# Patient Record
Sex: Female | Born: 1985 | Race: White | Hispanic: Yes | Marital: Single | Smoking: Never smoker
Health system: Southern US, Community
[De-identification: ages and names within clinical notes are randomized; demographics above are authoritative.]

## PROBLEM LIST (undated history)

## (undated) ENCOUNTER — Inpatient Hospital Stay (HOSPITAL_COMMUNITY): Payer: Self-pay

## (undated) DIAGNOSIS — J45909 Unspecified asthma, uncomplicated: Secondary | ICD-10-CM

## (undated) DIAGNOSIS — O139 Gestational [pregnancy-induced] hypertension without significant proteinuria, unspecified trimester: Secondary | ICD-10-CM

## (undated) HISTORY — PX: BREAST LUMPECTOMY: SHX2

## (undated) HISTORY — DX: Unspecified asthma, uncomplicated: J45.909

---

## 2012-06-11 ENCOUNTER — Emergency Department (HOSPITAL_COMMUNITY)
Admission: EM | Admit: 2012-06-11 | Discharge: 2012-06-11 | Disposition: A | Discharge status: Home / Self Care | Payer: Self-pay | Attending: Emergency Medicine | Admitting: Emergency Medicine

## 2012-06-11 ENCOUNTER — Encounter (HOSPITAL_COMMUNITY): Payer: Self-pay | Admitting: *Deleted

## 2012-06-11 ENCOUNTER — Emergency Department (HOSPITAL_COMMUNITY): Payer: Self-pay

## 2012-06-11 DIAGNOSIS — Y998 Other external cause status: Secondary | ICD-10-CM | POA: Insufficient documentation

## 2012-06-11 DIAGNOSIS — IMO0002 Reserved for concepts with insufficient information to code with codable children: Secondary | ICD-10-CM | POA: Insufficient documentation

## 2012-06-11 DIAGNOSIS — Y9301 Activity, walking, marching and hiking: Secondary | ICD-10-CM | POA: Insufficient documentation

## 2012-06-11 DIAGNOSIS — X500XXA Overexertion from strenuous movement or load, initial encounter: Secondary | ICD-10-CM | POA: Insufficient documentation

## 2012-06-11 MED ORDER — HYDROCODONE-ACETAMINOPHEN 5-325 MG PO TABS: 1.0000 | ORAL_TABLET | ORAL | Status: AC | PRN

## 2012-06-11 MED ORDER — HYDROCODONE-ACETAMINOPHEN 5-325 MG PO TABS
1.0000 | ORAL_TABLET | Freq: Once | ORAL | Status: AC
  Administered 2012-06-11: 1 via ORAL
  Filled 2012-06-11: qty 1

## 2012-06-11 NOTE — ED Provider Notes (Signed)
History     CSN: [id number]  Arrival date & time 06/11/12  1830   First MD Initiated Contact with Patient 06/11/12 2130      Chief Complaint  Patient presents with  . Hip Pain   HPI  History provided by the patient. Patient is a 26 year old female with no significant PMH who presents with complaints of right hip pain. Patient states that she was walking 3 days ago when she felt a pop and twisted her hip area. Since that time she has had some intermittent pains are worse with walking and movement. Pains have been more persistent all day today. Patient did try using some Aleve without any significant improvement. Pain does not radiate. Pain is a sharp throbbing pain. She denies any numbness weakness in foot. She denies any back pain. She denies any urinary complaints, abdominal pain or menstrual changes.    History reviewed. No pertinent past medical history.  History reviewed. No pertinent past surgical history.  History reviewed. No pertinent family history.  History  Substance Use Topics  . Smoking status: Never Smoker   . Smokeless tobacco: Not on file  . Alcohol Use: No    OB History    Grav Para Term Preterm Abortions TAB SAB Ect Mult Living                  Review of Systems  Constitutional: Negative for fever and chills.  Gastrointestinal: Negative for abdominal pain.  Genitourinary: Negative for dysuria, frequency, flank pain, vaginal bleeding and vaginal discharge.  Musculoskeletal: Negative for back pain.       Right hip pain  Neurological: Negative for weakness and numbness.    Allergies  Review of patient's allergies indicates no known allergies.  Home Medications   Current Outpatient Rx  Name Route Sig Dispense Refill  . ADULT MULTIVITAMIN W/MINERALS CH Oral Take 1 tablet by mouth daily.    Marland Kitchen NAPROXEN SODIUM 220 MG PO TABS Oral Take 660 mg by mouth 2 (two) times daily as needed. For pain      BP 121/78  Pulse 89  Temp 98 F (36.7 C) (Oral)   Resp 17  SpO2 100%  LMP 06/07/2012  Physical Exam  Nursing note and vitals reviewed. Constitutional: She is oriented to person, place, and time. She appears well-developed and well-nourished. No distress.  HENT:  Head: Normocephalic.  Cardiovascular: Normal rate and regular rhythm.   Pulmonary/Chest: Effort normal and breath sounds normal. No respiratory distress. She has no wheezes. She has no rales.  Abdominal: Soft. There is no tenderness.       No CVA tenderness  Musculoskeletal: She exhibits no edema.       Right hip: She exhibits tenderness. She exhibits no bony tenderness.       Legs:      Slightly reduced range of motion of right hip with pain. There is tenderness to palpation over the anterior portion of the proximal quadriceps. Patient has normal distal pulses and sensations in foot. Normal strength in the foot and lower leg. No gross deformities.  Neurological: She is alert and oriented to person, place, and time.  Skin: Skin is warm and dry.  Psychiatric: She has a normal mood and affect. Her behavior is normal.    ED Course  Procedures   Results for orders placed during the hospital encounter of 06/11/12  POCT PREGNANCY, URINE      Component Value Range   Preg Test, Ur NEGATIVE  NEGATIVE  Dg Hip Complete Right  06/11/2012  *RADIOLOGY REPORT*  Clinical Data: Right hip pain for 3 days.  No known injury.  RIGHT HIP - COMPLETE 2+ VIEW  Comparison: None.  Findings: Imaged bones, joints and soft tissues appear normal.  IMPRESSION: Negative exam.   Original Report Authenticated By: Bernadene Bell. D'ALESSIO, M.D.      1. Sprain of hip or thigh, right       MDM  9:50 PM patient seen and evaluated. [name] with no significant PMH. Patient with very minor trauma to cause symptoms. No fall or high impact. Patient is very concerned and felt she may need x-rays. There is very low clinical suspicion for fractures or dislocation. Patient is very tender over the hip.  X-rays were performed.  X-rays unremarkable. At this time suspect sprain or muscle strain. Will provided crutches to help with rest. Patient provided prescription for Norco 5 mg x10. Also instructed on rice and ibuprofen        Angus Seller, [place] 06/11/12 2234

## 2012-06-11 NOTE — ED Notes (Signed)
Pt states that she was walking and stepped wrong causing right hip pain. Pt reports continue pain, increases with movement

## 2012-06-11 NOTE — Progress Notes (Signed)
Orthopedic Tech Progress Note Patient Details:  Robin Price [birth year] [id number]  Ortho Devices Type of Ortho Device: Crutches Ortho Device/Splint Interventions: Casandra Doffing 06/11/2012, 10:38 PM

## 2012-06-15 NOTE — ED Provider Notes (Signed)
Medical screening examination/treatment/procedure(s) were performed by non-physician practitioner and as supervising physician I was immediately available for consultation/collaboration.  John-Adam Adryana Mogensen, M.D.     John-Adam Kayal Mula, MD 06/15/12 1205 

## 2015-05-01 LAB — OB RESULTS CONSOLE ABO/RH: RH TYPE: POSITIVE

## 2015-05-01 LAB — OB RESULTS CONSOLE RPR: RPR: NONREACTIVE

## 2015-05-01 LAB — OB RESULTS CONSOLE GC/CHLAMYDIA
Chlamydia: NEGATIVE
GC PROBE AMP, GENITAL: NEGATIVE

## 2015-05-01 LAB — OB RESULTS CONSOLE ANTIBODY SCREEN: Antibody Screen: NEGATIVE

## 2015-05-01 LAB — OB RESULTS CONSOLE HEPATITIS B SURFACE ANTIGEN: HEP B S AG: NEGATIVE

## 2015-05-01 LAB — OB RESULTS CONSOLE HIV ANTIBODY (ROUTINE TESTING): HIV: NONREACTIVE

## 2015-05-01 LAB — OB RESULTS CONSOLE RUBELLA ANTIBODY, IGM: RUBELLA: IMMUNE

## 2015-06-28 ENCOUNTER — Other Ambulatory Visit (HOSPITAL_COMMUNITY): Payer: Self-pay | Admitting: Nurse Practitioner

## 2015-06-28 DIAGNOSIS — Z3A19 19 weeks gestation of pregnancy: Secondary | ICD-10-CM

## 2015-06-28 DIAGNOSIS — Z3689 Encounter for other specified antenatal screening: Secondary | ICD-10-CM

## 2015-07-03 ENCOUNTER — Ambulatory Visit (HOSPITAL_COMMUNITY)
Admission: RE | Admit: 2015-07-03 | Discharge: 2015-07-03 | Disposition: A | Discharge status: Home / Self Care | Payer: Self-pay | Source: Ambulatory Visit | Attending: Nurse Practitioner | Admitting: Nurse Practitioner

## 2015-07-03 DIAGNOSIS — Z36 Encounter for antenatal screening of mother: Secondary | ICD-10-CM | POA: Insufficient documentation

## 2015-07-03 DIAGNOSIS — Z3A19 19 weeks gestation of pregnancy: Secondary | ICD-10-CM | POA: Insufficient documentation

## 2015-07-03 DIAGNOSIS — Z3689 Encounter for other specified antenatal screening: Secondary | ICD-10-CM

## 2015-10-13 ENCOUNTER — Encounter (HOSPITAL_COMMUNITY): Payer: Self-pay | Admitting: *Deleted

## 2015-10-13 ENCOUNTER — Inpatient Hospital Stay (HOSPITAL_COMMUNITY)
Admission: AD | Admit: 2015-10-13 | Discharge: 2015-10-14 | Disposition: A | Discharge status: Home / Self Care | Payer: Self-pay | Source: Ambulatory Visit | Attending: Obstetrics and Gynecology | Admitting: Obstetrics and Gynecology

## 2015-10-13 DIAGNOSIS — O4700 False labor before 37 completed weeks of gestation, unspecified trimester: Secondary | ICD-10-CM

## 2015-10-13 DIAGNOSIS — Z3A33 33 weeks gestation of pregnancy: Secondary | ICD-10-CM | POA: Insufficient documentation

## 2015-10-13 DIAGNOSIS — O479 False labor, unspecified: Secondary | ICD-10-CM

## 2015-10-13 LAB — URINE MICROSCOPIC-ADD ON

## 2015-10-13 LAB — URINALYSIS, ROUTINE W REFLEX MICROSCOPIC
Bilirubin Urine: NEGATIVE
GLUCOSE, UA: NEGATIVE mg/dL
Hgb urine dipstick: NEGATIVE
Ketones, ur: NEGATIVE mg/dL
Nitrite: NEGATIVE
PROTEIN: NEGATIVE mg/dL
Specific Gravity, Urine: 1.01 (ref 1.005–1.030)
pH: 7 (ref 5.0–8.0)

## 2015-10-13 MED ORDER — LACTATED RINGERS IV BOLUS (SEPSIS)
1000.0000 mL | Freq: Once | INTRAVENOUS | Status: AC
  Administered 2015-10-13: 1000 mL via INTRAVENOUS

## 2015-10-13 MED ORDER — NIFEDIPINE 10 MG PO CAPS
20.0000 mg | ORAL_CAPSULE | Freq: Three times a day (TID) | ORAL | Status: DC
Start: 2015-10-13 — End: 2015-10-14
  Administered 2015-10-13 (×2): 20 mg via ORAL
  Filled 2015-10-13 (×2): qty 2

## 2015-10-13 NOTE — MAU Provider Note (Signed)
  History     CSN: [id number]  Arrival date and time: 10/13/15 1906   None     Chief Complaint  Patient presents with  . Abdominal Pain  . Back Pain  . Vaginal Bleeding   HPI  Robin Price 29 y.o. G1P0 @ 344w0d presents toMAU stating that she has had contractions on and off for 2 days. She reported a small amount of bleeding at 530pm and none since.She denies LOF. Reports positive fetal movement.   History reviewed. No pertinent past medical history.  History reviewed. No pertinent past surgical history.  History reviewed. No pertinent family history.  Social History  Substance Use Topics  . Smoking status: Never Smoker   . Smokeless tobacco: None  . Alcohol Use: No    Allergies: No Known Allergies  Prescriptions prior to admission  Medication Sig Dispense Refill Last Dose  . Prenatal Vit-Fe Fumarate-FA (PRENATAL MULTIVITAMIN) TABS tablet Take 1 tablet by mouth daily at 12 noon.   10/12/2015 at Unknown time    Review of Systems  Constitutional: Negative for fever.  Gastrointestinal: Positive for abdominal pain.  Genitourinary:       Sm vag bleeding   All other systems reviewed and are negative.  Physical Exam   Blood pressure 111/77, pulse 100, temperature 98.3 F (36.8 C), resp. rate 18, height 5\' 1"  (1.549 m), weight 72.031 kg (158 lb 12.8 oz).  Physical Exam  Nursing note and vitals reviewed. Constitutional: She is oriented to person, place, and time. She appears well-developed and well-nourished. No distress.  HENT:  Head: Normocephalic and atraumatic.  Neck: Normal range of motion.  Cardiovascular: Normal rate and regular rhythm.   Respiratory: Effort normal and breath sounds normal. No respiratory distress.  GI: Soft. There is no tenderness.  Genitourinary: Vagina normal. No vaginal discharge found.  No vaginal bleeding on exam  Musculoskeletal: Normal range of motion.  Neurological: She is alert and oriented to person, place, and time.   Skin: Skin is warm and dry.  Psychiatric: She has a normal mood and affect. Her behavior is normal. Judgment and thought content normal.    MAU Course  Procedures  MDM  Lactated ringers 1000cc bolus and Procardia. Contractions resolved. FHT's Cat 1  Assessment and Plan  Preterm contractions  Discharge Follow up at regular scheduled appt.   Clemmons,Lori Grissett 10/13/2015, 10:05 PM

## 2015-10-13 NOTE — MAU Note (Signed)
Having back and abd pain for two days. Like period cramps. About 1730 saw dark red on panties. Has not seen anymore since. Peeing a lot and back hurts more in to mid back

## 2015-10-14 NOTE — Discharge Instructions (Signed)
Back Pain in Pregnancy °Back pain during pregnancy is common. It happens in about half of all pregnancies. It is important for you and your baby that you remain active during your pregnancy. If you feel that back pain is not allowing you to remain active or sleep well, it is time to see your caregiver. Back pain may be caused by several factors related to changes during your pregnancy. Fortunately, unless you had trouble with your back before your pregnancy, the pain is likely to get better after you deliver. °Low back pain usually occurs between the fifth and seventh months of pregnancy. It can, however, happen in the first couple months. Factors that increase the risk of back problems include:  °· Previous back problems. °· Injury to your back. °· Having twins or multiple births. °· A chronic cough. °· Stress. °· Job-related repetitive motions. °· Muscle or spinal disease in the back. °· Family history of back problems, ruptured (herniated) discs, or osteoporosis. °· Depression, anxiety, and panic attacks. °CAUSES  °· When you are pregnant, your body produces a hormone called relaxin. This hormone makes the ligaments connecting the low back and pubic bones more flexible. This flexibility allows the baby to be delivered more easily. When your ligaments are loose, your muscles need to work harder to support your back. Soreness in your back can come from tired muscles. Soreness can also come from back tissues that are irritated since they are receiving less support. °· As the baby grows, it puts pressure on the nerves and blood vessels in your pelvis. This can cause back pain. °· As the baby grows and gets heavier during pregnancy, the uterus pushes the stomach muscles forward and changes your center of gravity. This makes your back muscles work harder to maintain good posture. °SYMPTOMS  °Lumbar pain during pregnancy °Lumbar pain during pregnancy usually occurs at or above the waist in the center of the back. There  may be pain and numbness that radiates into your leg or foot. This is similar to low back pain experienced by non-pregnant women. It usually increases with sitting for long periods of time, standing, or repetitive lifting. Tenderness may also be present in the muscles along your upper back. °Posterior pelvic pain during pregnancy °Pain in the back of the pelvis is more common than lumbar pain in pregnancy. It is a deep pain felt in your side at the waistline, or across the tailbone (sacrum), or in both places. You may have pain on one or both sides. This pain can also go into the buttocks and backs of the upper thighs. Pubic and groin pain may also be present. The pain does not quickly resolve with rest, and morning stiffness may also be present. °Pelvic pain during pregnancy can be brought on by most activities. A high level of fitness before and during pregnancy may or may not prevent this problem. Labor pain is usually 1 to 2 minutes apart, lasts for about 1 minute, and involves a bearing down feeling or pressure in your pelvis. However, if you are at term with the pregnancy, constant low back pain can be the beginning of early labor, and you should be aware of this. °DIAGNOSIS  °X-rays of the back should not be done during the first 12 to 14 weeks of the pregnancy and only when absolutely necessary during the rest of the pregnancy. MRIs do not give off radiation and are safe during pregnancy. MRIs also should only be done when absolutely necessary. °HOME CARE INSTRUCTIONS °· Exercise   as directed by your caregiver. Exercise is the most effective way to prevent or manage back pain. If you have a back problem, it is especially important to avoid sports that require sudden body movements. Swimming and walking are great activities. °· Do not stand in one place for long periods of time. °· Do not wear high heels. °· Sit in chairs with good posture. Use a pillow on your lower back if necessary. Make sure your head  rests over your shoulders and is not hanging forward. °· Try sleeping on your side, preferably the left side, with a pillow or two between your legs. If you are sore after a night's rest, your bed may be too soft. Try placing a board between your mattress and box spring. °· Listen to your body when lifting. If you are experiencing pain, ask for help or try bending your knees more so you can use your leg muscles rather than your back muscles. Squat down when picking up something from the floor. Do not bend over. °· Eat a healthy diet. Try to gain weight within your caregiver's recommendations. °· Use heat or cold packs 3 to 4 times a day for 15 minutes to help with the pain. °· Only take over-the-counter or prescription medicines for pain, discomfort, or fever as directed by your caregiver. °Sudden (acute) back pain °· Use bed rest for only the most extreme, acute episodes of back pain. Prolonged bed rest over 48 hours will aggravate your condition. °· Ice is very effective for acute conditions. °¨ Put ice in a plastic bag. °¨ Place a towel between your skin and the bag. °¨ Leave the ice on for 10 to 20 minutes every 2 hours, or as needed. °· Using heat packs for 30 minutes prior to activities is also helpful. °Continued back pain °See your caregiver if you have continued problems. Your caregiver can help or refer you for appropriate physical therapy. With conditioning, most back problems can be avoided. Sometimes, a more serious issue may be the cause of back pain. You should be seen right away if new problems seem to be developing. Your caregiver may recommend: °· A maternity girdle. °· An elastic sling. °· A back brace. °· A massage therapist or acupuncture. °SEEK MEDICAL CARE IF:  °· You are not able to do most of your daily activities, even when taking the pain medicine you were given. °· You need a referral to a physical therapist or chiropractor. °· You want to try acupuncture. °SEEK IMMEDIATE MEDICAL CARE  IF: °· You develop numbness, tingling, weakness, or problems with the use of your arms or legs. °· You develop severe back pain that is no longer relieved with medicines. °· You have a sudden change in bowel or bladder control. °· You have increasing pain in other areas of the body. °· You develop shortness of breath, dizziness, or fainting. °· You develop nausea, vomiting, or sweating. °· You have back pain which is similar to labor pains. °· You have back pain along with your water breaking or vaginal bleeding. °· You have back pain or numbness that travels down your leg. °· Your back pain developed after you fell. °· You develop pain on one side of your back. You may have a kidney stone. °· You see blood in your urine. You may have a bladder infection or kidney stone. °· You have back pain with blisters. You may have shingles. °Back pain is fairly common during pregnancy but should not be accepted as just part of   the process. Back pain should always be treated as soon as possible. This will make your pregnancy as pleasant as possible.   This information is not intended to replace advice given to you by your health care provider. Make sure you discuss any questions you have with your health care provider.   Document Released: 01/08/2006 Document Revised: 12/23/2011 Document Reviewed: 02/19/2011 Elsevier Interactive Patient Education 2016 Else[employer] of the uterus can occur throughout pregnancy. Contractions are not always a sign that you are in labor.  WHAT ARE BRAXTON HICKS CONTRACTIONS?  Contractions that occur before labor are called Braxton Hicks contractions, or false labor. Toward the end of pregnancy (32-34 weeks), these contractions can develop more often and may become more forceful. This is not true labor because these contractions do not result in opening (dilatation) and thinning of the cervix. They are sometimes difficult to tell apart from true  labor because these contractions can be forceful and people have different pain tolerances. You should not feel embarrassed if you go to the hospital with false labor. Sometimes, the only way to tell if you are in true labor is for your health care provider to look for changes in the cervix. If there are no prenatal problems or other health problems associated with the pregnancy, it is completely safe to be sent home with false labor and await the onset of true labor. HOW CAN YOU TELL THE DIFFERENCE BETWEEN TRUE AND FALSE LABOR? False Labor  The contractions of false labor are usually shorter and not as hard as those of true labor.   The contractions are usually irregular.   The contractions are often felt in the front of the lower abdomen and in the groin.   The contractions may go away when you walk around or change positions while lying down.   The contractions get weaker and are shorter lasting as time goes on.   The contractions do not usually become progressively stronger, regular, and closer together as with true labor.  True Labor  Contractions in true labor last 30-70 seconds, become very regular, usually become more intense, and increase in frequency.   The contractions do not go away with walking.   The discomfort is usually felt in the top of the uterus and spreads to the lower abdomen and low back.   True labor can be determined by your health care provider with an exam. This will show that the cervix is dilating and getting thinner.  WHAT TO REMEMBER  Keep up with your usual exercises and follow other instructions given by your health care provider.   Take medicines as directed by your health care provider.   Keep your regular prenatal appointments.   Eat and drink lightly if you think you are going into labor.   If Braxton Hicks contractions are making you uncomfortable:   Change your position from lying down or resting to walking, or from walking to  resting.   Sit and rest in a tub of warm water.   Drink 2-3 glasses of water. Dehydration may cause these contractions.   Do slow and deep breathing several times an hour.  WHEN SHOULD I SEEK IMMEDIATE MEDICAL CARE? Seek immediate medical care if:  Your contractions become stronger, more regular, and closer together.   You have fluid leaking or gushing from your vagina.   You have a fever.   You pass blood-tinged mucus.   You have vaginal bleeding.   You have continuous  abdominal pain.   You have low back pain that you never had before.   You feel your baby's head pushing down and causing pelvic pressure.   Your baby is not moving as much as it used to.    This information is not intended to replace advice given to you by your health care provider. Make sure you discuss any questions you have with your health care provider.   Document Released: 09/30/2005 Document Revised: 10/05/2013 Document Reviewed: 07/12/2013 Elsevier Interactive Patient Education [employer]2016 Else[employer] Inc[employer]

## 2015-10-26 LAB — OB RESULTS CONSOLE GC/CHLAMYDIA
CHLAMYDIA, DNA PROBE: NEGATIVE
Gonorrhea: NEGATIVE

## 2015-10-28 LAB — OB RESULTS CONSOLE GBS: GBS: NEGATIVE

## 2015-11-22 ENCOUNTER — Encounter (HOSPITAL_COMMUNITY): Payer: Self-pay | Admitting: *Deleted

## 2015-11-22 ENCOUNTER — Inpatient Hospital Stay (HOSPITAL_COMMUNITY)
Admission: AD | Admit: 2015-11-22 | Discharge: 2015-11-22 | Disposition: A | Discharge status: Home / Self Care | Payer: Self-pay | Source: Ambulatory Visit | Attending: Family Medicine | Admitting: Family Medicine

## 2015-11-22 DIAGNOSIS — O26893 Other specified pregnancy related conditions, third trimester: Secondary | ICD-10-CM

## 2015-11-22 DIAGNOSIS — N898 Other specified noninflammatory disorders of vagina: Secondary | ICD-10-CM

## 2015-11-22 DIAGNOSIS — Z3A38 38 weeks gestation of pregnancy: Secondary | ICD-10-CM | POA: Insufficient documentation

## 2015-11-22 LAB — POCT FERN TEST: POCT FERN TEST: NEGATIVE

## 2015-11-22 NOTE — MAU Provider Note (Signed)
Ms. Robin Price is a 30 y.o. G1P0 at [redacted]w[redacted]d  who presents to MAU today complaining of LOF and contractions. The patient states that she has noted LOF since Monday morning. She states scant blood tinged discharge noted since then as well. She denies complications with the pregnancy. She reports good fetal movement. She states frequent contractions noted tonight.    BP 124/80 mmHg  Pulse 67 GENERAL: Well-developed, well-nourished female in no acute distress.  HEAD: Normocephalic, atraumatic.  CHEST: Normal effort of breathing, regular heart rate ABDOMEN: Soft, nontender, nondistended.  PELVIC: Normal external female genitalia. Vagina is pink and rugated.  Normal discharge.  Negative pooling. Gravid uterus.   EXTREMITIES: No cyanosis, clubbing, or edema Dilation: Fingertip Effacement (%): 70 Station: -1 Presentation: Vertex Exam by:: L. Vickki Muff, RN   Crist Fat - Negative  Fetal Monitoring:  Baseline: 120 bpm, moderate variability, + accelerations, no decelerations Contractions: q 4-6 minutes  A: SIUP at [redacted]w[redacted]d Membranes intact Vaginal discharge in pregnancy  P: Discharge home Labor precautions discussed Patient advised to follow-up with [facility] as scheduled or sooner PRN Patient may return to MAU as needed or if her condition were to change or worsen   Marny Lowenstein, PA-C 11/22/2015 3:01 AM

## 2015-11-22 NOTE — Discharge Instructions (Signed)
Braxton Hicks Contractions °Contractions of the uterus can occur throughout pregnancy. Contractions are not always a sign that you are in labor.  °WHAT ARE BRAXTON HICKS CONTRACTIONS?  °Contractions that occur before labor are called Braxton Hicks contractions, or false labor. Toward the end of pregnancy (32-34 weeks), these contractions can develop more often and may become more forceful. This is not true labor because these contractions do not result in opening (dilatation) and thinning of the cervix. They are sometimes difficult to tell apart from true labor because these contractions can be forceful and people have different pain tolerances. You should not feel embarrassed if you go to the hospital with false labor. Sometimes, the only way to tell if you are in true labor is for your health care provider to look for changes in the cervix. °If there are no prenatal problems or other health problems associated with the pregnancy, it is completely safe to be sent home with false labor and await the onset of true labor. °HOW CAN YOU TELL THE DIFFERENCE BETWEEN TRUE AND FALSE LABOR? °False Labor °· The contractions of false labor are usually shorter and not as hard as those of true labor.   °· The contractions are usually irregular.   °· The contractions are often felt in the front of the lower abdomen and in the groin.   °· The contractions may go away when you walk around or change positions while lying down.   °· The contractions get weaker and are shorter lasting as time goes on.   °· The contractions do not usually become progressively stronger, regular, and closer together as with true labor.   °True Labor °· Contractions in true labor last 30-70 seconds, become very regular, usually become more intense, and increase in frequency.   °· The contractions do not go away with walking.   °· The discomfort is usually felt in the top of the uterus and spreads to the lower abdomen and low back.   °· True labor can be  determined by your health care provider with an exam. This will show that the cervix is dilating and getting thinner.   °WHAT TO REMEMBER °· Keep up with your usual exercises and follow other instructions given by your health care provider.   °· Take medicines as directed by your health care provider.   °· Keep your regular prenatal appointments.   °· Eat and drink lightly if you think you are going into labor.   °· If Braxton Hicks contractions are making you uncomfortable:   °¨ Change your position from lying down or resting to walking, or from walking to resting.   °¨ Sit and rest in a tub of warm water.   °¨ Drink 2-3 glasses of water. Dehydration may cause these contractions.   °¨ Do slow and deep breathing several times an hour.   °WHEN SHOULD I SEEK IMMEDIATE MEDICAL CARE? °Seek immediate medical care if: °· Your contractions become stronger, more regular, and closer together.   °· You have fluid leaking or gushing from your vagina.   °· You have a fever.   °· You pass blood-tinged mucus.   °· You have vaginal bleeding.   °· You have continuous abdominal pain.   °· You have low back pain that you never had before.   °· You feel your baby's head pushing down and causing pelvic pressure.   °· Your baby is not moving as much as it used to.   °  °This information is not intended to replace advice given to you by your health care provider. Make sure you discuss any questions you have with your health care   provider. °  °Document Released: 09/30/2005 Document Revised: 10/05/2013 Document Reviewed: 07/12/2013 °Elsevier Interactive Patient Education ©2016 Elsevier Inc. ° °

## 2015-11-24 ENCOUNTER — Inpatient Hospital Stay (EMERGENCY_DEPARTMENT_HOSPITAL)
Admission: AD | Admit: 2015-11-24 | Discharge: 2015-11-24 | Disposition: A | Discharge status: Home / Self Care | Payer: Medicaid Other | Source: Ambulatory Visit | Attending: Family Medicine | Admitting: Family Medicine

## 2015-11-24 ENCOUNTER — Telehealth (HOSPITAL_COMMUNITY): Payer: Self-pay | Admitting: *Deleted

## 2015-11-24 ENCOUNTER — Encounter (HOSPITAL_COMMUNITY): Payer: Self-pay | Admitting: *Deleted

## 2015-11-24 DIAGNOSIS — R0989 Other specified symptoms and signs involving the circulatory and respiratory systems: Secondary | ICD-10-CM

## 2015-11-24 DIAGNOSIS — O471 False labor at or after 37 completed weeks of gestation: Secondary | ICD-10-CM

## 2015-11-24 DIAGNOSIS — O479 False labor, unspecified: Secondary | ICD-10-CM

## 2015-11-24 HISTORY — DX: Gestational (pregnancy-induced) hypertension without significant proteinuria, unspecified trimester: O13.9

## 2015-11-24 LAB — COMPREHENSIVE METABOLIC PANEL
ALK PHOS: 285 U/L — AB (ref 38–126)
ALT: 12 U/L — ABNORMAL LOW (ref 14–54)
AST: 23 U/L (ref 15–41)
Albumin: 3 g/dL — ABNORMAL LOW (ref 3.5–5.0)
Anion gap: 7 (ref 5–15)
BILIRUBIN TOTAL: 0.6 mg/dL (ref 0.3–1.2)
BUN: 8 mg/dL (ref 6–20)
CALCIUM: 8.9 mg/dL (ref 8.9–10.3)
CO2: 22 mmol/L (ref 22–32)
CREATININE: 0.43 mg/dL — AB (ref 0.44–1.00)
Chloride: 106 mmol/L (ref 101–111)
GFR calc Af Amer: >60 mL/min (ref >60)
GLUCOSE: 79 mg/dL (ref 65–99)
POTASSIUM: 3.6 mmol/L (ref 3.5–5.1)
Sodium: 135 mmol/L (ref 135–145)
TOTAL PROTEIN: 6.3 g/dL — AB (ref 6.5–8.1)

## 2015-11-24 LAB — URINALYSIS, ROUTINE W REFLEX MICROSCOPIC
BILIRUBIN URINE: NEGATIVE
GLUCOSE, UA: NEGATIVE mg/dL
KETONES UR: NEGATIVE mg/dL
NITRITE: NEGATIVE
PROTEIN: NEGATIVE mg/dL
Specific Gravity, Urine: 1.01 (ref 1.005–1.030)
pH: 7 (ref 5.0–8.0)

## 2015-11-24 LAB — CBC
HCT: 34 % — ABNORMAL LOW (ref 36.0–46.0)
Hemoglobin: 11.4 g/dL — ABNORMAL LOW (ref 12.0–15.0)
MCH: 30.8 pg (ref 26.0–34.0)
MCHC: 33.5 g/dL (ref 30.0–36.0)
MCV: 91.9 fL (ref 78.0–100.0)
PLATELETS: 132 10*3/uL — AB (ref 150–400)
RBC: 3.7 MIL/uL — ABNORMAL LOW (ref 3.87–5.11)
RDW: 13.4 % (ref 11.5–15.5)
WBC: 7.1 10*3/uL (ref 4.0–10.5)

## 2015-11-24 LAB — PROTEIN / CREATININE RATIO, URINE
Creatinine, Urine: 24 mg/dL
PROTEIN CREATININE RATIO: 0.79 mg/mg{creat} — AB (ref 0.00–0.15)
TOTAL PROTEIN, URINE: 19 mg/dL

## 2015-11-24 LAB — URINE MICROSCOPIC-ADD ON: Bacteria, UA: NONE SEEN

## 2015-11-24 NOTE — MAU Note (Signed)
Pt sent from [facility], has elevated BP, also having uc's, dilated 2 cm's.  Has pink discharge, denies bleeding or LOF.

## 2015-11-24 NOTE — MAU Provider Note (Signed)
ENTERED IN ERROR. PATIENT HAS A PROVIDER NOTE FOR THIS ENCOUNTER  History     CSN: [id number]  Arrival date and time: 11/24/15 4098      Chief Complaint  Patient presents with  . Hypertension  . Contractions   HPI    Past Medical History  Diagnosis Date  . Pregnancy induced hypertension     Past Surgical History  Procedure Laterality Date  . Breast lumpectomy      History reviewed. No pertinent family history.  Social History  Substance Use Topics  . Smoking status: Never Smoker   . Smokeless tobacco: None  . Alcohol Use: No    Allergies: No Known Allergies  Prescriptions prior to admission  Medication Sig Dispense Refill Last Dose  . Prenatal Vit-Fe Fumarate-FA (PRENATAL MULTIVITAMIN) TABS tablet Take 1 tablet by mouth daily at 12 noon.   11/23/2015 at Unknown time    ROS Physical Exam   Blood pressure 118/81, pulse 76, temperature 97.7 F (36.5 C), temperature source Oral, resp. rate 18.  Physical Exam   MAU Course  Procedures    Assessment and Plan  The patient was seen and examined by me also   Aviva Signs, CNM

## 2015-11-24 NOTE — MAU Provider Note (Signed)
Chief Complaint:  Hypertension and Contractions   First Provider Initiated Contact with Patient 11/24/15 1009     HPI  Robin Price is a 30 yr Old G1 at [redacted]w[redacted]d [facility] elevated blood pressure, contractions, and possible LOF which was ruled out per patient.  Patient was noted to have elevated blood pressures (per HD 144/101, 138/98) which patient states were obtained around 8AM today. Denies elevated blood pressures prior to this. Denies HA, visual changes, RUQ pain. Notes of bilateral hand swelling which she has noticed since 7th month of pregnancy. Patient also reported contractions that began around 9PM 2/9; contractions increased in intensity around midnight and are about 5 minutes apart. Rates pain 7-10. Patient states HD mentioned her cervix is 1.5cm dilated. Patient reports leakage of fluid since Monday morning. Upon further clarification, patient states she saw little blood on toilet paper after wiping the first time. Afterwards she continued to have "spotting" with pink fluid. Endorses fetal movement. No vaginal discharge. No recent sexual intercourse. Patient reports HD evaluated for ROM with speculum exam and fern which was negative; patient reports HD ruled out ROM.   Past Medical History: Past Medical History  Diagnosis Date  . Pregnancy induced hypertension     Past obstetric history: OB History  Gravida Para Term Preterm AB SAB TAB Ectopic Multiple Living  1             # Outcome Date GA Lbr Len/2nd Weight Sex Delivery Anes PTL Lv  1 Current               Past Surgical History: Past Surgical History  Procedure Laterality Date  . Breast lumpectomy      Family History: History reviewed. No pertinent family history.  Social History: Social History  Substance Use Topics  . Smoking status: Never Smoker   . Smokeless tobacco: None  . Alcohol Use: No    Allergies: No Known Allergies  Meds:  Prescriptions prior to admission   Medication Sig Dispense Refill Last Dose  . Prenatal Vit-Fe Fumarate-FA (PRENATAL MULTIVITAMIN) TABS tablet Take 1 tablet by mouth daily at 12 noon.   11/23/2015 at Unknown time    I have reviewed patient's Past Medical Hx, Surgical Hx, Family Hx, Social Hx, medications and allergies.   ROS:  Review of Systems  Constitutional: Negative for fever, chills and fatigue.  HENT: Negative for congestion and sinus pressure.   Respiratory: Negative for shortness of breath.   Cardiovascular: Negative for leg swelling.  Gastrointestinal: Negative for nausea, vomiting, abdominal pain, diarrhea and constipation.  Genitourinary: Positive for vaginal bleeding (pink show).  Neurological: Negative for dizziness, seizures, weakness, light-headedness and headaches.     Physical Exam  Patient Vitals for the past 24 hrs:  BP Temp Temp src Pulse Resp  11/24/15 1148 121/75 mmHg - - 69 -  11/24/15 1132 119/75 mmHg - - 63 -  11/24/15 1118 115/78 mmHg - - 61 -  11/24/15 1103 116/79 mmHg - - 67 -  11/24/15 1048 120/79 mmHg - - 68 -  11/24/15 1033 118/81 mmHg - - 68 -  11/24/15 1017 117/78 mmHg - - 67 -  11/24/15 1004 118/81 mmHg - - 76 -  11/24/15 0952 124/90 mmHg 97.7 F (36.5 C) Oral 77 18   Constitutional: Well-developed, well-nourished female in no acute distress.  Cardiovascular: normal rate and rhythm Respiratory: normal effort, clear to auscultation bilaterally GI: Abd soft, non-tender, gravid appropriate for gestational age.   No  rebound or guarding. MS: Extremities nontender, no edema, normal ROM Neurologic: Alert and oriented x 4. DTRs 1+ with no clonus GU: Neg CVAT.  PELVIC EXAM: Cervix firm, posterior, neg CMT, uterus nontender, Fundal Height consistent with dates, adnexa without tenderness, enlargement, or mass   Dilation: 1 Effacement (%): 50 Station: -3 Exam by:: Artelia Laroche CNm     FHT:  Baseline 140 , moderate variability, accelerations present, no decelerations Contractions:  q 8 mins   Labs: Results for orders placed or performed during the hospital encounter of 11/24/15 (from the past 24 hour(s))  Urinalysis, Routine w reflex microscopic (not at [facility])     Status: Abnormal   Collection Time: 11/24/15  9:40 AM  Result Value Ref Range   Color, Urine YELLOW YELLOW   APPearance CLEAR CLEAR   Specific Gravity, Urine 1.010 1.005 - 1.030   pH 7.0 5.0 - 8.0   Glucose, UA NEGATIVE NEGATIVE mg/dL   Hgb urine dipstick TRACE (A) NEGATIVE   Bilirubin Urine NEGATIVE NEGATIVE   Ketones, ur NEGATIVE NEGATIVE mg/dL   Protein, ur NEGATIVE NEGATIVE mg/dL   Nitrite NEGATIVE NEGATIVE   Leukocytes, UA TRACE (A) NEGATIVE  Urine microscopic-add on     Status: Abnormal   Collection Time: 11/24/15  9:40 AM  Result Value Ref Range   Squamous Epithelial / LPF 0-5 (A) NONE SEEN   WBC, UA 0-5 0 - 5 WBC/hpf   RBC / HPF 0-5 0 - 5 RBC/hpf   Bacteria, UA NONE SEEN NONE SEEN  Protein / creatinine ratio, urine     Status: Abnormal   Collection Time: 11/24/15  9:40 AM  Result Value Ref Range   Creatinine, Urine 24.00 mg/dL   Total Protein, Urine 19 mg/dL   Protein Creatinine Ratio 0.79 (H) 0.00 - 0.15 mg/mg[Cre]  Comprehensive metabolic panel     Status: Abnormal   Collection Time: 11/24/15 10:42 AM  Result Value Ref Range   Sodium 135 135 - 145 mmol/L   Potassium 3.6 3.5 - 5.1 mmol/L   Chloride 106 101 - 111 mmol/L   CO2 22 22 - 32 mmol/L   Glucose, Bld 79 65 - 99 mg/dL   BUN 8 6 - 20 mg/dL   Creatinine, Ser 1.61 (L) 0.44 - 1.00 mg/dL   Calcium 8.9 8.9 - 09.6 mg/dL   Total Protein 6.3 (L) 6.5 - 8.1 g/dL   Albumin 3.0 (L) 3.5 - 5.0 g/dL   AST 23 15 - 41 U/L   ALT 12 (L) 14 - 54 U/L   Alkaline Phosphatase 285 (H) 38 - 126 U/L   Total Bilirubin 0.6 0.3 - 1.2 mg/dL   GFR calc non Af Amer >60 >60 mL/min   GFR calc Af Amer >60 >60 mL/min   Anion gap 7 5 - 15  CBC     Status: Abnormal   Collection Time: 11/24/15 10:43 AM  Result Value Ref Range   WBC 7.1 4.0 - 10.5 K/uL    RBC 3.70 (L) 3.87 - 5.11 MIL/uL   Hemoglobin 11.4 (L) 12.0 - 15.0 g/dL   HCT 04.5 (L) 40.9 - 81.1 %   MCV 91.9 78.0 - 100.0 fL   MCH 30.8 26.0 - 34.0 pg   MCHC 33.5 30.0 - 36.0 g/dL   RDW 91.4 78.2 - 95.6 %   Platelets 132 (L) 150 - 400 K/uL      Imaging:  No results found.  MAU Course/MDM: I have ordered labs and reviewed results.  NST  reviewed  Consult Dr Adrian Blackwater with presentation, exam findings and test results. Discussed only initial BP was a little elevated while all others are low. Patient has no edema and normal reflexes. No headache, visual changes or Upper abd pain.  Low suspicion for preeclampsia per Dr Adrian Blackwater.  He recommends strict PIH precautions and IOL next week.   Treatments in MAU included none.    Assessment: SIUP at [redacted]w[redacted]d  Latent labor vs prodromal contractions Labile blood pressures, normal while here. Normal labs except for Pr/Cr ratio elevated while UA protein negative  Plan: Discharge home Preeclampsia precautions Labor precautions and fetal kick counts Follow up in Office for prenatal visits and recheck    Medication List    ASK your doctor about these medications        prenatal multivitamin Tabs tablet  Take 1 tablet by mouth daily at 12 noon.       Pt stable at time of discharge.  Wynelle Bourgeois CNM, MSN Certified Nurse-Midwife 11/24/2015 12:06 PM

## 2015-11-24 NOTE — Telephone Encounter (Signed)
Preadmission screen  

## 2015-11-24 NOTE — Discharge Instructions (Signed)
Hypertension During Pregnancy °Hypertension is also called high blood pressure. Blood pressure moves blood in your body. Sometimes, the force that moves the blood becomes too strong. When you are pregnant, this condition should be watched carefully. It can cause problems for you and your baby. °HOME CARE  °· Make and keep all of your doctor visits. °· Take medicine as told by your doctor. Tell your doctor about all medicines you take. °· Eat very little salt. °· Exercise regularly. °· Do not drink alcohol. °· Do not smoke. °· Do not have drinks with caffeine. °· Lie on your left side when resting. °· Your health care provider may ask you to take one low-dose aspirin (81mg) each day. °GET HELP RIGHT AWAY IF: °· You have bad belly (abdominal) pain. °· You have sudden puffiness (swelling) in the hands, ankles, or face. °· You gain 4 pounds (1.8 kilograms) or more in 1 week. °· You throw up (vomit) repeatedly. °· You have bleeding from the vagina. °· You do not feel the baby moving as much. °· You have a headache. °· You have blurred or double vision. °· You have muscle twitching or spasms. °· You have shortness of breath. °· You have blue fingernails and lips. °· You have blood in your pee (urine). °MAKE SURE YOU: °· Understand these instructions. °· Will watch your condition. °· Will get help right away if you are not doing well or get worse. °  °This information is not intended to replace advice given to you by your health care provider. Make sure you discuss any questions you have with your health care provider. °  °Document Released: 11/02/2010 Document Revised: 10/21/2014 Document Reviewed: 04/29/2013 °Elsevier Interactive Patient Education ©2016 [employer] ° °

## 2015-11-27 ENCOUNTER — Inpatient Hospital Stay (HOSPITAL_COMMUNITY): Payer: Medicaid Other

## 2015-11-28 ENCOUNTER — Encounter (HOSPITAL_COMMUNITY): Payer: Self-pay | Admitting: Certified Registered Nurse Anesthetist

## 2015-11-28 ENCOUNTER — Encounter (HOSPITAL_COMMUNITY): Payer: Self-pay

## 2015-11-28 ENCOUNTER — Inpatient Hospital Stay (HOSPITAL_COMMUNITY)
Admission: AD | Admit: 2015-11-28 | Discharge: 2015-12-01 | DRG: 766 | Disposition: A | Discharge status: Home / Self Care | Payer: Medicaid Other | Source: Ambulatory Visit | Attending: Family Medicine | Admitting: Family Medicine

## 2015-11-28 VITALS — BP 116/66 | HR 94 | Temp 98.0°F | Resp 18 | Ht 61.0 in | Wt 162.0 lb

## 2015-11-28 DIAGNOSIS — O9952 Diseases of the respiratory system complicating childbirth: Secondary | ICD-10-CM | POA: Diagnosis present

## 2015-11-28 DIAGNOSIS — O134 Gestational [pregnancy-induced] hypertension without significant proteinuria, complicating childbirth: Secondary | ICD-10-CM | POA: Diagnosis present

## 2015-11-28 DIAGNOSIS — J45909 Unspecified asthma, uncomplicated: Secondary | ICD-10-CM | POA: Diagnosis present

## 2015-11-28 DIAGNOSIS — Z3A41 41 weeks gestation of pregnancy: Secondary | ICD-10-CM

## 2015-11-28 DIAGNOSIS — O480 Post-term pregnancy: Secondary | ICD-10-CM | POA: Diagnosis present

## 2015-11-28 DIAGNOSIS — Z98891 History of uterine scar from previous surgery: Secondary | ICD-10-CM

## 2015-11-28 LAB — CBC
HCT: 34.9 % — ABNORMAL LOW (ref 36.0–46.0)
HEMOGLOBIN: 12.3 g/dL (ref 12.0–15.0)
MCH: 31.9 pg (ref 26.0–34.0)
MCHC: 35.2 g/dL (ref 30.0–36.0)
MCV: 90.4 fL (ref 78.0–100.0)
PLATELETS: 149 10*3/uL — AB (ref 150–400)
RBC: 3.86 MIL/uL — AB (ref 3.87–5.11)
RDW: 13.4 % (ref 11.5–15.5)
WBC: 5.9 10*3/uL (ref 4.0–10.5)

## 2015-11-28 LAB — ABO/RH: ABO/RH(D): O POS

## 2015-11-28 LAB — TYPE AND SCREEN
ABO/RH(D): O POS
ANTIBODY SCREEN: NEGATIVE

## 2015-11-28 LAB — RPR: RPR: NONREACTIVE

## 2015-11-28 MED ORDER — OXYTOCIN 10 UNIT/ML IJ SOLN
1.0000 m[IU]/min | INTRAVENOUS | Status: DC
  Administered 2015-11-28: 1 m[IU]/min via INTRAVENOUS

## 2015-11-28 MED ORDER — TERBUTALINE SULFATE 1 MG/ML IJ SOLN
0.2500 mg | Freq: Once | INTRAMUSCULAR | Status: AC | PRN
  Administered 2015-11-29: 0.25 mg via SUBCUTANEOUS

## 2015-11-28 MED ORDER — LACTATED RINGERS IV SOLN
INTRAVENOUS | Status: DC
  Administered 2015-11-28 (×2): 125 mL/h via INTRAVENOUS
  Administered 2015-11-29 (×4): via INTRAVENOUS

## 2015-11-28 MED ORDER — MISOPROSTOL 25 MCG QUARTER TABLET
25.0000 ug | ORAL_TABLET | ORAL | Status: DC | PRN
  Administered 2015-11-28: 25 ug via VAGINAL
  Filled 2015-11-28 (×2): qty 0.25

## 2015-11-28 MED ORDER — OXYTOCIN BOLUS FROM INFUSION: 500.0000 mL | INTRAVENOUS | Status: DC

## 2015-11-28 MED ORDER — LACTATED RINGERS IV SOLN: 500.0000 mL | INTRAVENOUS | Status: DC | PRN

## 2015-11-28 MED ORDER — CITRIC ACID-SODIUM CITRATE 334-500 MG/5ML PO SOLN
30.0000 mL | ORAL | Status: DC | PRN
  Administered 2015-11-29: 30 mL via ORAL
  Filled 2015-11-28: qty 15

## 2015-11-28 MED ORDER — TERBUTALINE SULFATE 1 MG/ML IJ SOLN
0.2500 mg | Freq: Once | INTRAMUSCULAR | Status: DC | PRN
  Filled 2015-11-28: qty 1

## 2015-11-28 MED ORDER — LIDOCAINE HCL (PF) 1 % IJ SOLN: 30.0000 mL | INTRAMUSCULAR | Status: DC | PRN

## 2015-11-28 MED ORDER — ACETAMINOPHEN 325 MG PO TABS
650.0000 mg | ORAL_TABLET | ORAL | Status: DC | PRN
  Administered 2015-11-29: 650 mg via ORAL
  Filled 2015-11-28: qty 2

## 2015-11-28 MED ORDER — ONDANSETRON HCL 4 MG/2ML IJ SOLN: 4.0000 mg | Freq: Four times a day (QID) | INTRAMUSCULAR | Status: DC | PRN

## 2015-11-28 MED ORDER — OXYTOCIN 10 UNIT/ML IJ SOLN
2.5000 [IU]/h | INTRAVENOUS | Status: DC
  Filled 2015-11-28: qty 4

## 2015-11-28 MED ORDER — FENTANYL CITRATE (PF) 100 MCG/2ML IJ SOLN
100.0000 ug | INTRAMUSCULAR | Status: DC | PRN
  Administered 2015-11-28 – 2015-11-29 (×2): 100 ug via INTRAVENOUS
  Filled 2015-11-28 (×2): qty 2

## 2015-11-28 NOTE — H&P (Signed)
LABOR AND DELIVERY ADMISSION HISTORY AND PHYSICAL NOTE  Robin Price is a 30 y.o. female G1P0 with IUP at [redacted]w[redacted]d by LMP/[redacted]w[redacted]d [place] presenting for IOL 2/2 PDIOL.   She reports positive fetal movement. She endorses blood pink tinged mucus discharge for the last week ending three days ago, otherwise denies vaginal bleeding or leakage of fluids.   Patient seen for elevated blood pressures 4 days ago (per HD 144/101, 138/98) found to have Urine Pr/Cr ratio 0.79. Urine protein was negative and BPs labile but normal in MAU. Denies HA, visual changes, RUQ pain today.  Denies any substance use during this pregnancy. She is having a boy + inpatient circ. Plans to breast feed with bottle suppliment. Condoms for birth control.   Sono  FRH 150, EFW 64%tile, Normal Anatomy, Cephalic   Prenatal History/Complications:  Past Medical History: Past Medical History  Diagnosis Date  . Pregnancy induced hypertension   . Asthma     Past Surgical History: Past Surgical History  Procedure Laterality Date  . Breast lumpectomy      11th grade L breast    Obstetrical History: OB History    Gravida Para Term Preterm AB TAB SAB Ectopic Multiple Living   1               Social History: Social History   Social History  . Marital Status: Single    Spouse Name: N/A  . Number of Children: N/A  . Years of Education: N/A   Social History Main Topics  . Smoking status: Never Smoker   . Smokeless tobacco: None  . Alcohol Use: No  . Drug Use: No  . Sexual Activity: Yes   Other Topics Concern  . None   Social History Narrative    Family History: Family History  Problem Relation Age of Onset  . Diabetes Mother     Allergies: No Known Allergies  Prescriptions prior to admission  Medication Sig Dispense Refill Last Dose  . Prenatal Vit-Fe Fumarate-FA (PRENATAL MULTIVITAMIN) TABS tablet Take 1 tablet by mouth daily at 12 noon.   11/23/2015 at Unknown time     Review of  Systems   All systems reviewed and negative except as stated in HPI  Blood pressure 135/91, pulse 66, temperature 98.1 F (36.7 C), temperature source Oral, resp. rate 20, height  (1.549 m), weight 162 lb (73.483 kg). General appearance: alert, cooperative and appears stated age Lungs: clear to auscultation bilaterally Heart: regular rate and rhythm Abdomen: soft, non-tender; bowel sounds normal Extremities: No calf swelling or tenderness Presentation: cephalic  Fetal monitoring: 140/mod/+a/-d Uterine activity: q80min, mild Dilation: Fingertip Exam by:: Dr. Ashok Pall     Prenatal labs: ABO, Rh: --/--/O POS (02/14 0725) Antibody: NEG (02/14 0725) Rubella: Immune RPR: Nonreactive (07/18 0000)  HBsAg: Negative (07/18 0000)  HIV: Non-reactive (07/18 0000)  GBS: Negative (01/14 0000)  1 hr Glucola: 148 3 hr GGT: FBS 76, 1hr 144, 2hr 139 (3 hr value not recorded) Genetic screening:  Quad Screen Neg Anatomy US: Normal  Prenatal Transfer Tool  Maternal Diabetes: No Genetic Screening: Normal Maternal Ultrasounds/Referrals: Normal save for limited views of the heart, patient did not present for f/u u/s Fetal Ultrasounds or other Referrals:  None Maternal Substance Abuse:  No Significant Maternal Medications:  None Significant Maternal Lab Results: None  Results for orders placed or performed during the hospital encounter of 11/28/15 (from the past 24 hour(s))  CBC   Collection Time: 11/28/15  7:25 AM  Result Value Ref Range   WBC 5.9 4.0 - 10.5 K/uL   RBC 3.86 (L) 3.87 - 5.11 MIL/uL   Hemoglobin 12.3 12.0 - 15.0 g/dL   HCT 19.1 (L) 47.8 - 29.5 %   MCV 90.4 78.0 - 100.0 fL   MCH 31.9 26.0 - 34.0 pg   MCHC 35.2 30.0 - 36.0 g/dL   RDW 62.1 30.8 - 65.7 %   Platelets 149 (L) 150 - 400 K/uL  Type and screen   Collection Time: 11/28/15  7:25 AM  Result Value Ref Range   ABO/RH(D) O POS    Antibody Screen NEG    Sample Expiration 12/01/2015     Patient Active Problem List    Diagnosis Date Noted  . Post term pregnancy 11/28/2015  . Preterm uterine contractions 10/13/2015    Assessment: Robin Price is a 30 y.o. G1P0 at [redacted]w[redacted]d here for PDIOL  #Labor: cytotec, will attempt to place foley, as needs ripening #Pain: Non-pharmacologic #FWB: Category 1 #ID:  GBS Neg,  #Blood pressure: No history of cHTN. PIH with labile bps. Pr/Cr 0.79 last week. Here BP wnl, no symptoms. Will continue to monitor BP.  #MOF: Breast + Bottle #MOC: Condoms, discussed LARC with patient #Circ:  outpatient  Cherrie Gauze [facility] 11/28/2015, 9:57 AM

## 2015-11-28 NOTE — Progress Notes (Signed)
Labor Progress Note  Robin Price is a 30 y.o. G1P0 at [redacted]w[redacted]d  admitted for induction of labor due to PD.  S:  Patient is currently sleeping. Family at bedside.    O:  BP 135/81 mmHg  Pulse 89  Temp(Src) 98 F (36.7 C) (Oral)  Resp 20  Ht  (1.549 m)  Wt 73.483 kg (162 lb)  BMI 30.63 kg/m2     FHT:  FHR: 150 bpm, variability: moderate,  accelerations:  Present,  decelerations:  Absent UC:   Every 2.5-3  mins SVE:   Dilation: 6 Effacement (%): 70 Station: -2 Exam by:: Herma Carson, rn Intact   Labs: Lab Results  Component Value Date   WBC 5.9 11/28/2015   HGB 12.3 11/28/2015   HCT 34.9* 11/28/2015   MCV 90.4 11/28/2015   PLT 149* 11/28/2015    Assessment / Plan: 30 y.o. G1P0 [redacted]w[redacted]d active labor   Induction of labor due to PD. S/p cytotec and FB. Pit started   Labor: Pitocin started for augmentation Fetal Wellbeing:  Category I Pain Control:  Fentanyl; epidural when patient desires.  Anticipated MOD:  NSVD   Palma Holter, MD PGY 1 Family Medicine

## 2015-11-29 ENCOUNTER — Encounter (HOSPITAL_COMMUNITY)
Admission: AD | Disposition: A | Discharge status: Home / Self Care | Payer: Self-pay | Source: Ambulatory Visit | Attending: Family Medicine

## 2015-11-29 ENCOUNTER — Encounter (HOSPITAL_COMMUNITY): Payer: Self-pay

## 2015-11-29 ENCOUNTER — Inpatient Hospital Stay (HOSPITAL_COMMUNITY): Payer: Medicaid Other | Admitting: Anesthesiology

## 2015-11-29 DIAGNOSIS — O99824 Streptococcus B carrier state complicating childbirth: Secondary | ICD-10-CM

## 2015-11-29 DIAGNOSIS — Z3A39 39 weeks gestation of pregnancy: Secondary | ICD-10-CM

## 2015-11-29 SURGERY — Surgical Case
Anesthesia: Epidural | Site: Abdomen

## 2015-11-29 MED ORDER — PHENYLEPHRINE 40 MCG/ML (10ML) SYRINGE FOR IV PUSH (FOR BLOOD PRESSURE SUPPORT)
PREFILLED_SYRINGE | INTRAVENOUS | Status: AC
  Administered 2015-11-29: 200 ug
  Filled 2015-11-29: qty 20

## 2015-11-29 MED ORDER — MORPHINE SULFATE (PF) 0.5 MG/ML IJ SOLN
INTRAMUSCULAR | Status: AC
  Filled 2015-11-29: qty 10

## 2015-11-29 MED ORDER — KETOROLAC TROMETHAMINE 30 MG/ML IJ SOLN
30.0000 mg | Freq: Four times a day (QID) | INTRAMUSCULAR | Status: AC | PRN
  Administered 2015-11-29: 30 mg via INTRAVENOUS
  Filled 2015-11-29: qty 1

## 2015-11-29 MED ORDER — FENTANYL 2.5 MCG/ML BUPIVACAINE 1/10 % EPIDURAL INFUSION (WH - ANES)
INTRAMUSCULAR | Status: DC | PRN
  Administered 2015-11-29: 12.5 mL/h via EPIDURAL

## 2015-11-29 MED ORDER — NALOXONE HCL 2 MG/2ML IJ SOSY
1.0000 ug/kg/h | PREFILLED_SYRINGE | INTRAVENOUS | Status: DC | PRN
  Filled 2015-11-29: qty 2

## 2015-11-29 MED ORDER — SCOPOLAMINE 1 MG/3DAYS TD PT72
1.0000 | MEDICATED_PATCH | Freq: Once | TRANSDERMAL | Status: DC
  Administered 2015-11-29: 1.5 mg via TRANSDERMAL
  Filled 2015-11-29: qty 1

## 2015-11-29 MED ORDER — LIDOCAINE HCL (PF) 1 % IJ SOLN
INTRAMUSCULAR | Status: DC | PRN
  Administered 2015-11-29 (×2): 4 mL

## 2015-11-29 MED ORDER — INFLUENZA VAC SPLIT QUAD 0.5 ML IM SUSY
0.5000 mL | PREFILLED_SYRINGE | INTRAMUSCULAR | Status: AC
  Administered 2015-11-30: 0.5 mL via INTRAMUSCULAR
  Filled 2015-11-29: qty 0.5

## 2015-11-29 MED ORDER — PHENYLEPHRINE 40 MCG/ML (10ML) SYRINGE FOR IV PUSH (FOR BLOOD PRESSURE SUPPORT)
80.0000 ug | PREFILLED_SYRINGE | INTRAVENOUS | Status: DC | PRN
  Filled 2015-11-29: qty 2

## 2015-11-29 MED ORDER — CLINDAMYCIN PHOSPHATE 900 MG/50ML IV SOLN
900.0000 mg | Freq: Three times a day (TID) | INTRAVENOUS | Status: AC
  Administered 2015-11-29 – 2015-11-30 (×3): 900 mg via INTRAVENOUS
  Filled 2015-11-29 (×3): qty 50

## 2015-11-29 MED ORDER — MEPERIDINE HCL 25 MG/ML IJ SOLN
INTRAMUSCULAR | Status: AC
  Filled 2015-11-29: qty 1

## 2015-11-29 MED ORDER — HYDROMORPHONE HCL 1 MG/ML IJ SOLN: 0.2500 mg | INTRAMUSCULAR | Status: DC | PRN

## 2015-11-29 MED ORDER — GENTAMICIN SULFATE 40 MG/ML IJ SOLN
1.5000 mg/kg | Freq: Three times a day (TID) | INTRAVENOUS | Status: AC
  Administered 2015-11-29 – 2015-11-30 (×3): 110 mg via INTRAVENOUS
  Filled 2015-11-29 (×3): qty 2.75

## 2015-11-29 MED ORDER — ONDANSETRON HCL 4 MG/2ML IJ SOLN
4.0000 mg | Freq: Three times a day (TID) | INTRAMUSCULAR | Status: DC | PRN
  Administered 2015-11-29: 4 mg via INTRAVENOUS
  Filled 2015-11-29: qty 2

## 2015-11-29 MED ORDER — DIPHENHYDRAMINE HCL 50 MG/ML IJ SOLN: 12.5000 mg | INTRAMUSCULAR | Status: DC | PRN

## 2015-11-29 MED ORDER — PROMETHAZINE HCL 25 MG/ML IJ SOLN
25.0000 mg | Freq: Four times a day (QID) | INTRAMUSCULAR | Status: DC | PRN
  Administered 2015-11-29: 25 mg via INTRAVENOUS
  Filled 2015-11-29: qty 1

## 2015-11-29 MED ORDER — ONDANSETRON HCL 4 MG/2ML IJ SOLN
INTRAMUSCULAR | Status: AC
  Filled 2015-11-29: qty 2

## 2015-11-29 MED ORDER — KETOROLAC TROMETHAMINE 30 MG/ML IJ SOLN: 30.0000 mg | Freq: Once | INTRAMUSCULAR | Status: DC

## 2015-11-29 MED ORDER — NALBUPHINE HCL 10 MG/ML IJ SOLN: 5.0000 mg | Freq: Once | INTRAMUSCULAR | Status: DC | PRN

## 2015-11-29 MED ORDER — SIMETHICONE 80 MG PO CHEW: 80.0000 mg | CHEWABLE_TABLET | ORAL | Status: DC | PRN

## 2015-11-29 MED ORDER — PRENATAL MULTIVITAMIN CH
1.0000 | ORAL_TABLET | Freq: Every day | ORAL | Status: DC
  Administered 2015-11-30 – 2015-12-01 (×2): 1 via ORAL
  Filled 2015-11-29 (×2): qty 1

## 2015-11-29 MED ORDER — BUPIVACAINE HCL (PF) 0.5 % IJ SOLN
INTRAMUSCULAR | Status: DC | PRN
  Administered 2015-11-29: 30 mL

## 2015-11-29 MED ORDER — LACTATED RINGERS IV SOLN: 500.0000 mL | Freq: Once | INTRAVENOUS | Status: DC

## 2015-11-29 MED ORDER — MEPERIDINE HCL 25 MG/ML IJ SOLN: 6.2500 mg | INTRAMUSCULAR | Status: DC | PRN

## 2015-11-29 MED ORDER — BUPIVACAINE HCL (PF) 0.5 % IJ SOLN
INTRAMUSCULAR | Status: AC
  Filled 2015-11-29: qty 30

## 2015-11-29 MED ORDER — ACETAMINOPHEN 500 MG PO TABS
1000.0000 mg | ORAL_TABLET | Freq: Four times a day (QID) | ORAL | Status: AC
  Administered 2015-11-30 (×2): 1000 mg via ORAL
  Filled 2015-11-29 (×2): qty 2

## 2015-11-29 MED ORDER — LACTATED RINGERS IV SOLN: INTRAVENOUS | Status: DC

## 2015-11-29 MED ORDER — IBUPROFEN 600 MG PO TABS
600.0000 mg | ORAL_TABLET | Freq: Four times a day (QID) | ORAL | Status: DC
  Administered 2015-11-30 – 2015-12-01 (×6): 600 mg via ORAL
  Filled 2015-11-29 (×6): qty 1

## 2015-11-29 MED ORDER — FENTANYL 2.5 MCG/ML BUPIVACAINE 1/10 % EPIDURAL INFUSION (WH - ANES): 14.0000 mL/h | INTRAMUSCULAR | Status: DC | PRN

## 2015-11-29 MED ORDER — WITCH HAZEL-GLYCERIN EX PADS: 1.0000 "application " | MEDICATED_PAD | CUTANEOUS | Status: DC | PRN

## 2015-11-29 MED ORDER — PHENYLEPHRINE 40 MCG/ML (10ML) SYRINGE FOR IV PUSH (FOR BLOOD PRESSURE SUPPORT)
80.0000 ug | PREFILLED_SYRINGE | INTRAVENOUS | Status: DC | PRN
Start: 2015-11-29 — End: 2015-11-29
  Filled 2015-11-29: qty 2

## 2015-11-29 MED ORDER — SENNOSIDES-DOCUSATE SODIUM 8.6-50 MG PO TABS
2.0000 | ORAL_TABLET | ORAL | Status: DC
  Administered 2015-11-30 – 2015-12-01 (×2): 2 via ORAL
  Filled 2015-11-29 (×2): qty 2

## 2015-11-29 MED ORDER — OXYCODONE-ACETAMINOPHEN 5-325 MG PO TABS
1.0000 | ORAL_TABLET | ORAL | Status: DC | PRN
  Administered 2015-11-30 – 2015-12-01 (×3): 1 via ORAL
  Filled 2015-11-29 (×3): qty 1

## 2015-11-29 MED ORDER — MORPHINE SULFATE (PF) 0.5 MG/ML IJ SOLN
INTRAMUSCULAR | Status: DC | PRN
  Administered 2015-11-29: 4 mg via EPIDURAL
  Administered 2015-11-29: 1 mg via INTRAVENOUS

## 2015-11-29 MED ORDER — PHENYLEPHRINE 40 MCG/ML (10ML) SYRINGE FOR IV PUSH (FOR BLOOD PRESSURE SUPPORT)
PREFILLED_SYRINGE | INTRAVENOUS | Status: AC
  Filled 2015-11-29: qty 40

## 2015-11-29 MED ORDER — OXYTOCIN 10 UNIT/ML IJ SOLN: 1.0000 m[IU]/min | INTRAVENOUS | Status: DC

## 2015-11-29 MED ORDER — OXYTOCIN 10 UNIT/ML IJ SOLN
INTRAMUSCULAR | Status: AC
  Filled 2015-11-29: qty 4

## 2015-11-29 MED ORDER — SODIUM CHLORIDE 0.9 % IV SOLN
2.0000 g | Freq: Four times a day (QID) | INTRAVENOUS | Status: AC
  Administered 2015-11-29 – 2015-11-30 (×4): 2 g via INTRAVENOUS
  Filled 2015-11-29 (×7): qty 2000

## 2015-11-29 MED ORDER — SODIUM CHLORIDE 0.9 % IV SOLN
2.0000 g | Freq: Four times a day (QID) | INTRAVENOUS | Status: DC
  Administered 2015-11-29: 2 g via INTRAVENOUS
  Filled 2015-11-29 (×2): qty 2000

## 2015-11-29 MED ORDER — PROMETHAZINE HCL 25 MG/ML IJ SOLN: 6.2500 mg | INTRAMUSCULAR | Status: DC | PRN

## 2015-11-29 MED ORDER — EPHEDRINE 5 MG/ML INJ
10.0000 mg | INTRAVENOUS | Status: DC | PRN
  Filled 2015-11-29: qty 2

## 2015-11-29 MED ORDER — NALBUPHINE HCL 10 MG/ML IJ SOLN: 5.0000 mg | INTRAMUSCULAR | Status: DC | PRN

## 2015-11-29 MED ORDER — SODIUM BICARBONATE 8.4 % IV SOLN
INTRAVENOUS | Status: DC | PRN
  Administered 2015-11-29 (×3): 5 mL via EPIDURAL

## 2015-11-29 MED ORDER — LACTATED RINGERS IV BOLUS (SEPSIS)
500.0000 mL | Freq: Once | INTRAVENOUS | Status: AC
  Administered 2015-11-29: 500 mL via INTRAVENOUS

## 2015-11-29 MED ORDER — KETOROLAC TROMETHAMINE 30 MG/ML IJ SOLN
INTRAMUSCULAR | Status: AC
  Filled 2015-11-29: qty 1

## 2015-11-29 MED ORDER — METHYLERGONOVINE MALEATE 0.2 MG/ML IJ SOLN
INTRAMUSCULAR | Status: DC | PRN
  Administered 2015-11-29: 0.2 mg via INTRAMUSCULAR

## 2015-11-29 MED ORDER — ZOLPIDEM TARTRATE 5 MG PO TABS: 5.0000 mg | ORAL_TABLET | Freq: Every evening | ORAL | Status: DC | PRN

## 2015-11-29 MED ORDER — IBUPROFEN 600 MG PO TABS: 600.0000 mg | ORAL_TABLET | Freq: Four times a day (QID) | ORAL | Status: DC | PRN

## 2015-11-29 MED ORDER — OXYCODONE-ACETAMINOPHEN 5-325 MG PO TABS: 2.0000 | ORAL_TABLET | ORAL | Status: DC | PRN

## 2015-11-29 MED ORDER — PHENYLEPHRINE HCL 10 MG/ML IJ SOLN
INTRAMUSCULAR | Status: DC | PRN
  Administered 2015-11-29 (×2): 80 ug via INTRAVENOUS
  Administered 2015-11-29: 40 ug via INTRAVENOUS
  Administered 2015-11-29 (×3): 80 ug via INTRAVENOUS
  Administered 2015-11-29: 40 ug via INTRAVENOUS
  Administered 2015-11-29 (×2): 80 ug via INTRAVENOUS
  Administered 2015-11-29: 40 ug via INTRAVENOUS
  Administered 2015-11-29: 80 ug via INTRAVENOUS
  Administered 2015-11-29: 40 ug via INTRAVENOUS

## 2015-11-29 MED ORDER — ACETAMINOPHEN 325 MG PO TABS: 650.0000 mg | ORAL_TABLET | ORAL | Status: DC | PRN

## 2015-11-29 MED ORDER — GENTAMICIN SULFATE 40 MG/ML IJ SOLN
1.5000 mg/kg | Freq: Three times a day (TID) | INTRAVENOUS | Status: DC
  Administered 2015-11-29: 52.5 mg via INTRAVENOUS
  Filled 2015-11-29 (×2): qty 2.75

## 2015-11-29 MED ORDER — FENTANYL 2.5 MCG/ML BUPIVACAINE 1/10 % EPIDURAL INFUSION (WH - ANES)
INTRAMUSCULAR | Status: AC
  Administered 2015-11-29: 04:00:00
  Filled 2015-11-29: qty 125

## 2015-11-29 MED ORDER — MENTHOL 3 MG MT LOZG: 1.0000 | LOZENGE | OROMUCOSAL | Status: DC | PRN

## 2015-11-29 MED ORDER — SODIUM CHLORIDE 0.9 % IR SOLN
Status: DC | PRN
Start: 2015-11-29 — End: 2015-11-29
  Administered 2015-11-29: 1000 mL

## 2015-11-29 MED ORDER — NALOXONE HCL 0.4 MG/ML IJ SOLN: 0.4000 mg | INTRAMUSCULAR | Status: DC | PRN

## 2015-11-29 MED ORDER — METHYLERGONOVINE MALEATE 0.2 MG PO TABS
0.2000 mg | ORAL_TABLET | Freq: Three times a day (TID) | ORAL | Status: DC
  Filled 2015-11-29: qty 1

## 2015-11-29 MED ORDER — MAGNESIUM HYDROXIDE 400 MG/5ML PO SUSP: 30.0000 mL | ORAL | Status: DC | PRN

## 2015-11-29 MED ORDER — LACTATED RINGERS IV SOLN
INTRAVENOUS | Status: DC | PRN
  Administered 2015-11-29: 08:00:00 via INTRAVENOUS

## 2015-11-29 MED ORDER — FERROUS SULFATE 325 (65 FE) MG PO TABS
325.0000 mg | ORAL_TABLET | Freq: Two times a day (BID) | ORAL | Status: DC
  Administered 2015-11-30 – 2015-12-01 (×3): 325 mg via ORAL
  Filled 2015-11-29 (×3): qty 1

## 2015-11-29 MED ORDER — MEASLES, MUMPS & RUBELLA VAC ~~LOC~~ INJ: 0.5000 mL | INJECTION | Freq: Once | SUBCUTANEOUS | Status: DC

## 2015-11-29 MED ORDER — ONDANSETRON HCL 4 MG/2ML IJ SOLN: INTRAMUSCULAR | Status: DC | PRN

## 2015-11-29 MED ORDER — TETANUS-DIPHTH-ACELL PERTUSSIS 5-2.5-18.5 LF-MCG/0.5 IM SUSP: 0.5000 mL | Freq: Once | INTRAMUSCULAR | Status: DC

## 2015-11-29 MED ORDER — SODIUM CHLORIDE 0.9% FLUSH: 3.0000 mL | INTRAVENOUS | Status: DC | PRN

## 2015-11-29 MED ORDER — DIPHENHYDRAMINE HCL 25 MG PO CAPS: 25.0000 mg | ORAL_CAPSULE | ORAL | Status: DC | PRN

## 2015-11-29 MED ORDER — SIMETHICONE 80 MG PO CHEW
80.0000 mg | CHEWABLE_TABLET | ORAL | Status: DC
  Administered 2015-11-30 – 2015-12-01 (×2): 80 mg via ORAL
  Filled 2015-11-29 (×2): qty 1

## 2015-11-29 MED ORDER — DIPHENHYDRAMINE HCL 25 MG PO CAPS: 25.0000 mg | ORAL_CAPSULE | Freq: Four times a day (QID) | ORAL | Status: DC | PRN

## 2015-11-29 MED ORDER — KETOROLAC TROMETHAMINE 30 MG/ML IJ SOLN
30.0000 mg | Freq: Four times a day (QID) | INTRAMUSCULAR | Status: AC | PRN
  Administered 2015-11-29: 30 mg via INTRAMUSCULAR

## 2015-11-29 MED ORDER — OXYTOCIN 10 UNIT/ML IJ SOLN
40.0000 [IU] | INTRAVENOUS | Status: DC | PRN
  Administered 2015-11-29: 40 [IU] via INTRAVENOUS

## 2015-11-29 MED ORDER — LACTATED RINGERS IV SOLN
2.5000 [IU]/h | INTRAVENOUS | Status: AC
  Administered 2015-11-29: 2.5 [IU]/h via INTRAVENOUS
  Filled 2015-11-29: qty 4

## 2015-11-29 MED ORDER — LANOLIN HYDROUS EX OINT: 1.0000 "application " | TOPICAL_OINTMENT | CUTANEOUS | Status: DC | PRN

## 2015-11-29 MED ORDER — DIBUCAINE 1 % RE OINT: 1.0000 "application " | TOPICAL_OINTMENT | RECTAL | Status: DC | PRN

## 2015-11-29 SURGICAL SUPPLY — 26 items
CLAMP CORD UMBIL (MISCELLANEOUS) ×3 IMPLANT
CLOTH BEACON ORANGE TIMEOUT ST (SAFETY) ×3 IMPLANT
DRSG OPSITE POSTOP 4X10 (GAUZE/BANDAGES/DRESSINGS) ×3 IMPLANT
DRSG PAD ABDOMINAL 8X10 ST (GAUZE/BANDAGES/DRESSINGS) ×3 IMPLANT
DURAPREP 26ML APPLICATOR (WOUND CARE) ×3 IMPLANT
ELECT REM PT RETURN 9FT ADLT (ELECTROSURGICAL) ×3
ELECTRODE REM PT RTRN 9FT ADLT (ELECTROSURGICAL) ×1 IMPLANT
GLOVE BIOGEL PI IND STRL 7.0 (GLOVE) ×3 IMPLANT
GLOVE BIOGEL PI INDICATOR 7.0 (GLOVE) ×6
GLOVE ECLIPSE 7.0 STRL STRAW (GLOVE) ×3 IMPLANT
GOWN STRL REUS W/TWL LRG LVL3 (GOWN DISPOSABLE) ×6 IMPLANT
KIT ABG SYR 3ML LUER SLIP (SYRINGE) ×3 IMPLANT
NEEDLE HYPO 22GX1.5 SAFETY (NEEDLE) ×3 IMPLANT
NEEDLE HYPO 25X5/8 SAFETYGLIDE (NEEDLE) ×3 IMPLANT
NS IRRIG 1000ML POUR BTL (IV SOLUTION) ×3 IMPLANT
PACK C SECTION WH (CUSTOM PROCEDURE TRAY) ×3 IMPLANT
PAD ABD 7.5X8 STRL (GAUZE/BANDAGES/DRESSINGS) ×3 IMPLANT
PAD OB MATERNITY 4.3X12.25 (PERSONAL CARE ITEMS) ×3 IMPLANT
SPONGE GAUZE 4X4 12PLY STER LF (GAUZE/BANDAGES/DRESSINGS) ×6 IMPLANT
SUT PDS AB 0 CTX 36 PDP370T (SUTURE) ×3 IMPLANT
SUT VIC AB 0 CTX 36 (SUTURE) ×8
SUT VIC AB 0 CTX36XBRD ANBCTRL (SUTURE) ×4 IMPLANT
SUT VIC AB 4-0 KS 27 (SUTURE) ×3 IMPLANT
SYR CONTROL 10ML LL (SYRINGE) ×3 IMPLANT
TAPE CLOTH SURG 4X10 WHT LF (GAUZE/BANDAGES/DRESSINGS) ×3 IMPLANT
TOWEL OR 17X24 6PK STRL BLUE (TOWEL DISPOSABLE) ×3 IMPLANT

## 2015-11-29 NOTE — Progress Notes (Signed)
Dr. Alvester Morin called around 1500 to verify continuing the pitocin and the methergine po. She stated that she wanted to continue it. Med student Nedra Hai called recently to notify Dr. Alvester Morin that the patient has not had methergine since she arrived on the unit due to emesis x4. Waiting for response.

## 2015-11-29 NOTE — Op Note (Signed)
[name] PROCEDURE DATE: 11/29/2015  PREOPERATIVE DIAGNOSES: Intrauterine pregnancy at [redacted]w[redacted]d weeks gestation; non-reassuring fetal status; Triple I, failed IOL for postdates  POSTOPERATIVE DIAGNOSES: The same  PROCEDURE: Primary Low Transverse Cesarean Section  SURGEON:  Dr. Jaynie Collins  ASSISTANT:  Dr. Shonna Chock  ANESTHESIOLOGIST: Dr. Leilani Able  INDICATIONS: Robin Price is a 30 y.o. G1P0 at [redacted]w[redacted]d here for cesarean section secondary to the indications listed under preoperative diagnoses; please see preoperative note for further details.  The risks of cesarean section were discussed with the patient including but were not limited to: bleeding which may require transfusion or reoperation; infection which may require antibiotics; injury to bowel, bladder, ureters or other surrounding organs; injury to the fetus; need for additional procedures including hysterectomy in the event of a life-threatening hemorrhage; placental abnormalities wth subsequent pregnancies, incisional problems, thromboembolic phenomenon and other postoperative/anesthesia complications.   The patient concurred with the proposed plan, giving informed written consent for the procedure.    FINDINGS:  Viable female infant in cephalic presentation.  Apgars 2 and 9.  Arterial cord pH 7.0. Very thick meconium in amniotic fluid.  Intact placenta, three vessel cord.  Normal uterus, fallopian tubes and ovaries bilaterally.  ANESTHESIA: Epidural INTRAVENOUS FLUIDS: 1800 ml ESTIMATED BLOOD LOSS: 800 ml URINE OUTPUT:  100 ml SPECIMENS: Placenta sent to pathology COMPLICATIONS: None immediate  PROCEDURE IN DETAIL:  The patient preoperatively received intravenous antibiotics and had sequential compression devices applied to her lower extremities.  She was then taken to the operating room where the epidural anesthesia was dosed up to surgical level and was found to be adequate. She was then placed in a  dorsal supine position with a leftward tilt, and prepped and draped in a sterile manner.  A foley catheter was placed into her bladder and attached to constant gravity.  After an adequate timeout was performed, a Pfannenstiel skin incision was made with scalpel and carried through to the underlying layer of fascia. The fascia was incised in the midline, and this incision was extended bilaterally bluntly. The rectus muscles were separated in the midline bluntly and the peritoneum was entered bluntly. Attention was turned to the lower uterine segment where a low transverse hysterotomy was made with a scalpel and extended bilaterally bluntly.  The infant was successfully delivered, the cord was clamped and cut, and the infant was handed over to the awaiting neonatology team.  Uterine massage was then administered, and the placenta delivered intact with a three-vessel cord.  There was some atony noted and a dose of methergine was administered.  The uterus was then cleared of clot and debris.  The hysterotomy was closed with 0 Vicryl in a running locked fashion, and an imbricating layer was also placed with 0 Vicryl.  Figure-of-eight 0 Vicryl serosal stitches were placed to help with hemostasis.  The pelvis was cleared of all clot and debris. Hemostasis was confirmed on all surfaces.  The peritoneum was reapproximated using 0 Vicryl interrupted stitches. The fascia was then closed using 0 PDS in a running fashion.  The subcutaneous layer was irrigated, and 30 ml of 0.5% Marcaine was injected subcutaneously around the incision.  The skin was closed with a 4-0 Vicryl subcuticular stitch. The patient tolerated the procedure well. Sponge, lap, instrument and needle counts were correct x 3.  She was taken to the recovery room in stable condition.    Jaynie Collins, MD, FACOG Attending Obstetrician & Gynecologist Faculty Practice, [facility]

## 2015-11-29 NOTE — Addendum Note (Signed)
Addendum  created 11/29/15 1021 by Leilani Able, MD   Modules edited: Anesthesia Attestations, Anesthesia Responsible Staff, Anesthesia Review and Sign Navigator Section, Clinical Notes   Clinical Notes:  File: [id number]

## 2015-11-29 NOTE — Progress Notes (Addendum)
LABOR PROGRESS NOTE  Robin Price is a 30 y.o. G1P0 at [redacted]w[redacted]d  admitted for pdiol  Subjective: Very painful contractions  Objective: BP 130/92 mmHg  Pulse 98  Temp(Src) 98 F (36.7 C) (Oral)  Resp 20  Ht  (1.549 m)  Wt 162 lb (73.483 kg)  BMI 30.63 kg/m2 or  Filed Vitals:   11/28/15 1930 11/28/15 2305 11/28/15 2345 11/29/15 0000  BP: 135/81 127/70 128/95 130/92  Pulse: 89 90 87 98  Temp: 98 F (36.7 C)     TempSrc: Oral     Resp: 20     Height:      Weight:        140/mod/-a/-d  Dilation: 6 Effacement (%): 80 Cervical Position: Middle Station: -3 Presentation: Vertex Exam by:: Dr.Wouk  Labs: Lab Results  Component Value Date   WBC 5.9 11/28/2015   HGB 12.3 11/28/2015   HCT 34.9* 11/28/2015   MCV 90.4 11/28/2015   PLT 149* 11/28/2015    Patient Active Problem List   Diagnosis Date Noted  . Post term pregnancy 11/28/2015  . Preterm uterine contractions 10/13/2015    Assessment / Plan: 30 y.o. G1P0 at [redacted]w[redacted]d here for pdiol  Labor: progressing w/ pitocin. Just no s/p srom with mec. Placed iupc per nursing request for better contraction monitoring Fetal Wellbeing:  Cat 1. Intermittent periods of minimal reactivity followed by periods of moderate reactivity; likely fetal sleep wake cycles. Will monitor closely. Pain Control:  Non-pharmacologic Anticipated MOD:  vaginal  Silvano Bilis, MD 11/29/2015, 2:31 AM

## 2015-11-29 NOTE — Anesthesia Procedure Notes (Signed)
Epidural Patient location during procedure: OB  Staffing Anesthesiologist: Sharlize Hoar Performed by: anesthesiologist   Preanesthetic Checklist Completed: patient identified, site marked, surgical consent, pre-op evaluation, timeout performed, IV checked, risks and benefits discussed and monitors and equipment checked  Epidural Patient position: sitting Prep: site prepped and draped and DuraPrep Patient monitoring: continuous pulse ox and blood pressure Approach: midline Location: L3-L4 Injection technique: LOR saline  Needle:  Needle type: Tuohy  Needle gauge: 17 G Needle length: 9 cm and 9 Needle insertion depth: 5 cm cm Catheter type: closed end flexible Catheter size: 19 Gauge Catheter at skin depth: 10 cm Test dose: negative  Assessment Events: blood not aspirated, injection not painful, no injection resistance, negative IV test and no paresthesia  Additional Notes Patient identified. Risks/Benefits/Options discussed with patient including but not limited to bleeding, infection, nerve damage, paralysis, failed block, incomplete pain control, headache, blood pressure changes, nausea, vomiting, reactions to medication both or allergic, itching and postpartum back pain. Confirmed with bedside nurse the patient's most recent platelet count. Confirmed with patient that they are not currently taking any anticoagulation, have any bleeding history or any family history of bleeding disorders. Patient expressed understanding and wished to proceed. All questions were answered. Sterile technique was used throughout the entire procedure. Please see nursing notes for vital signs. Test dose was given through epidural catheter and negative prior to continuing to dose epidural or start infusion. Warning signs of high block given to the patient including shortness of breath, tingling/numbness in hands, complete motor block, or any concerning symptoms with instructions to call for help. Patient was  given instructions on fall risk and not to get out of bed. All questions and concerns addressed with instructions to call with any issues or inadequate analgesia.      

## 2015-11-29 NOTE — Addendum Note (Signed)
Addendum  created 11/29/15 1337 by Jhonnie Garner, CRNA   Modules edited: Clinical Notes   Clinical Notes:  File: [id number]

## 2015-11-29 NOTE — Anesthesia Preprocedure Evaluation (Addendum)
Anesthesia Evaluation  Patient identified by MRN, date of birth, ID band Patient awake    Reviewed: Allergy & Precautions, NPO status , Patient's Chart, lab work & pertinent test results  History of Anesthesia Complications Negative for: history of anesthetic complications  Airway Mallampati: III  TM Distance: >3 FB Neck ROM: Full    Dental no notable dental hx. (+) Dental Advisory Given   Pulmonary asthma ,    Pulmonary exam normal breath sounds clear to auscultation       Cardiovascular hypertension, negative cardio ROS Normal cardiovascular exam Rhythm:Regular Rate:Normal     Neuro/Psych negative neurological ROS  negative psych ROS   GI/Hepatic negative GI ROS, Neg liver ROS,   Endo/Other  obesity  Renal/GU negative Renal ROS  negative genitourinary   Musculoskeletal negative musculoskeletal ROS (+)   Abdominal   Peds negative pediatric ROS (+)  Hematology negative hematology ROS (+)   Anesthesia Other Findings   Reproductive/Obstetrics (+) Pregnancy                            Anesthesia Physical Anesthesia Plan  ASA: II  Anesthesia Plan: Epidural   Post-op Pain Management:    Induction:   Airway Management Planned:   Additional Equipment:   Intra-op Plan:   Post-operative Plan:   Informed Consent: I have reviewed the patients History and Physical, chart, labs and discussed the procedure including the risks, benefits and alternatives for the proposed anesthesia with the patient or authorized representative who has indicated his/her understanding and acceptance.   Dental advisory given  Plan Discussed with: CRNA and Surgeon  Anesthesia Plan Comments:        Anesthesia Quick Evaluation

## 2015-11-29 NOTE — Progress Notes (Signed)
Med Student Nedra Hai called back and I updated her on patient's status see orders.

## 2015-11-29 NOTE — Anesthesia Postprocedure Evaluation (Signed)
Anesthesia Post Note  Patient: Robin Price  Procedure(s) Performed: Procedure(s) (LRB): CESAREAN SECTION (N/A)  Patient location during evaluation: PACU Anesthesia Type: Epidural Level of consciousness: awake Pain management: pain level controlled Vital Signs Assessment: post-procedure vital signs reviewed and stable Respiratory status: spontaneous breathing Cardiovascular status: stable Postop Assessment: no headache, no backache, epidural receding, patient able to bend at knees and no signs of nausea or vomiting Anesthetic complications: no    Last Vitals:  Filed Vitals:   11/29/15 0945 11/29/15 1000  BP: 121/72   Pulse: 100 111  Temp:    Resp: 18 23    Last Pain:  Filed Vitals:   11/29/15 1000  PainSc: 6                  Lewie Loron

## 2015-11-29 NOTE — Progress Notes (Signed)
Pharmacy called to clarify which antibiotics were campatable with LR with pitocin.  Ampicillin ,is not compatable, clindamycin and Gentamicin are compatable with LR with Pitocin.

## 2015-11-29 NOTE — Transfer of Care (Signed)
Immediate Anesthesia Transfer of Care Note  Patient: Robin Price  Procedure(s) Performed: Procedure(s): CESAREAN SECTION (N/A)  Patient Location: PACU  Anesthesia Type:Epidural  Level of Consciousness: awake, alert  and oriented  Airway & Oxygen Therapy: Patient Spontanous Breathing and Patient connected to nasal cannula oxygen  Post-op Assessment: Report given to RN and Post -op Vital signs reviewed and stable  Post vital signs: Reviewed and stable  Last Vitals:  Filed Vitals:   11/29/15 0718 11/29/15 0720  BP:    Pulse: 101 101  Temp:    Resp:      Complications: No apparent anesthesia complications

## 2015-11-29 NOTE — Anesthesia Postprocedure Evaluation (Signed)
Anesthesia Post Note  Patient: Robin Price  Procedure(s) Performed: Procedure(s) (LRB): CESAREAN SECTION (N/A)  Patient location during evaluation: Mother Baby Anesthesia Type: Epidural Level of consciousness: awake and alert and oriented Pain management: pain level controlled Vital Signs Assessment: post-procedure vital signs reviewed and stable Respiratory status: spontaneous breathing Cardiovascular status: blood pressure returned to baseline Postop Assessment: no headache, no backache, epidural receding, patient able to bend at knees and no signs of nausea or vomiting Anesthetic complications: no    Last Vitals:  Filed Vitals:   11/29/15 1147 11/29/15 1230  BP: 111/78 108/70  Pulse: 90 94  Temp: 36.8 C 36.8 C  Resp: 18 18    Last Pain:  Filed Vitals:   11/29/15 1243  PainSc: 4                  Edit Ricciardelli

## 2015-11-29 NOTE — Consult Note (Signed)
Neonatology Note:   Attendance at C-section:    I was asked by Dr. Macon Large to attend this urgent primary C/S at term due to a prolonged FHR deceleration. The mother is a G1P0 O pos, GBS neg with induction for postdates and pregnancy-induced HTN. Mother had a fever of 100.4 degrees 1 hour prior to delivery and got a dose of Ampicillin just before delivery. ROM 5.5 hours prior to delivery, fluid with thick meconium. No CAN. Infant flaccid, deeply meconium-stained, malodorous, and apneic at birth, but with normal HR. We suctioned a large amount of thick meconium with the bulb, then I intubated the baby with a 3.5 mm ETT, getting a small amount of thin, non-particulate meconium from below the cords. The baby began to have some respiratory effort, so we gave stimulation and BBO2. The baby pinked up quickly and was breathing regularly and without distress. Tone normalized by about 3 minutes of age and he was crying vigorously.  Ap 2/9. Lungs clear to ausc in DR. [facility] sepsis calculator recommends that this well-appearing infant be handled routinely, without labs. To CN to care of Pediatrician.   Doretha Sou, MD

## 2015-11-29 NOTE — Lactation Note (Signed)
This note was copied from a baby's chart. Lactation Assessment: Asked by nursery RN to see mom as baby has not fed yet at 4 hours old. Mom has been throwing up and does not want to breast feed at this time. family member holding sleeping baby Encouraged to page for assist when ready. BF brochure given with resources for support after DC.   Patient Name: Robin Price [id number] Date: 11/29/2015 Reason for consult: Initial assessment   Maternal Data Formula Feeding for Exclusion: No Does the patient have breastfeeding experience prior to this delivery?: No  Feeding    LATCH Score/Interventions                      Lactation Tools Discussed/Used     Consult Status Consult Status: Follow-up Date: 11/29/15 Follow-up type: In-patient    Pamelia Hoit 11/29/2015, 12:06 PM

## 2015-11-29 NOTE — Progress Notes (Signed)
LABOR PROGRESS NOTE  Robin Price is a 30 y.o. G1P0 at [redacted]w[redacted]d  admitted for IOL for postdates.  Subjective: Patient is having a prolonged fetal heart rate deceleration.  Objective: BP 122/72 mmHg  Pulse 110  Temp(Src) 100.4 F (38 C) (Axillary)  Resp 16  Ht  (1.549 m)  Wt 162 lb (73.483 kg)  BMI 30.63 kg/m2  SpO2 95% or  Filed Vitals:   11/29/15 0633 11/29/15 0635 11/29/15 0640 11/29/15 0645  BP: 122/72     Pulse: 96 96 104 110  Temp:  99.6 F (37.6 C)  100.4 F (38 C)  TempSrc:  Oral  Axillary  Resp:      Height:      Weight:      SpO2: 95% 95% 95% 95%   140/mod/-a/prolonged deceleration to a nadir of 60s for about 7-8 mins with return to baseline of 140  after administration of terbutaline  Dilation: 5.5 Effacement (%): 80 Cervical Position: Middle Station: -3 Presentation: Vertex Exam by:: Dr.Wouk  Labs: Lab Results  Component Value Date   WBC 5.9 11/28/2015   HGB 12.3 11/28/2015   HCT 34.9* 11/28/2015   MCV 90.4 11/28/2015   PLT 149* 11/28/2015     Assessment / Plan: 30 y.o. G1P0 at [redacted]w[redacted]d here for pdiol, now with prolonged deceleration/NRFHT (Category II) and failure of cervical dilatation.  Also has concern for Triple I given recent temperature. She is remote form vaginal deliver.  Cesarean section recommended.  The risks of cesarean section discussed with the patient included but were not limited to: bleeding which may require transfusion or reoperation; infection which may require antibiotics; injury to bowel, bladder, ureters or other surrounding organs; injury to the fetus; need for additional procedures including hysterectomy in the event of a life-threatening hemorrhage; placental abnormalities wth subsequent pregnancies, incisional problems, thromboembolic phenomenon and other postoperative/anesthesia complications. The patient concurred with the proposed plan, giving informed written consent for the procedure.  Anesthesia and OR aware.  Preoperative prophylactic antibiotics and SCDs ordered on call to the OR.  To OR when ready.   Tereso Newcomer, MD 11/29/2015, 7:29 AM

## 2015-11-29 NOTE — Lactation Note (Signed)
This note was copied from a baby's chart. Lactation Consultation Note Initial visit at 13 hours of age. Mom is recovering from c/s and has had nausea and vomiting.  Baby has syringe of formula early while mom was sick.  Mom is feeling better now.  Baby STS with unstable temps.  Assisted with hand expression right nipple ifsflat with dense tissue under nipple and areolar complex.  Small round larger than pea size nodule felt on superior side of nipple.  Does not change with massage, but is felt under areola.  Mom reports previous cysts removed on left breast, but states this is new today. Baby is mouthing at the breast but not eager to latch and suckle.  Left breast if more compressible with few drops expressed.  Baby opens wide but does not latch.  Baby left STS.  Hand pump demonstrated and application of #20 NS that RN gave verified for appropriate size.  Mom is very sleepy and closing eyes.  Moms sister is at bedside supportive.  Mom will need to pump with DEBP if she continues to use NS.   Patient Name: Robin Price ZOXWR'U Date: 11/29/2015 Reason for consult: Initial assessment   Maternal Data Has patient been taught Hand Expression?: Yes  Feeding Feeding Type: Breast Fed  LATCH Score/Interventions Latch: Too sleepy or reluctant, no latch achieved, no sucking elicited.  Audible Swallowing: None  Type of Nipple: Flat Intervention(s): Hand pump  Comfort (Breast/Nipple): Soft / non-tender     Hold (Positioning): Assistance needed to correctly position infant at breast and maintain latch. Intervention(s): Breastfeeding basics reviewed;Support Pillows;Position options;Skin to skin  LATCH Score: 4  Lactation Tools Discussed/Used     Consult Status Consult Status: Follow-up Date: 11/30/15 Follow-up type: In-patient    Beverely Risen Arvella Merles 11/29/2015, 9:31 PM

## 2015-11-30 ENCOUNTER — Encounter (HOSPITAL_COMMUNITY): Payer: Self-pay | Admitting: Obstetrics & Gynecology

## 2015-11-30 LAB — CBC
HCT: 28.1 % — ABNORMAL LOW (ref 36.0–46.0)
Hemoglobin: 9.5 g/dL — ABNORMAL LOW (ref 12.0–15.0)
MCH: 30.9 pg (ref 26.0–34.0)
MCHC: 33.8 g/dL (ref 30.0–36.0)
MCV: 91.5 fL (ref 78.0–100.0)
PLATELETS: 122 10*3/uL — AB (ref 150–400)
RBC: 3.07 MIL/uL — ABNORMAL LOW (ref 3.87–5.11)
RDW: 13.9 % (ref 11.5–15.5)
WBC: 11.2 10*3/uL — AB (ref 4.0–10.5)

## 2015-11-30 NOTE — Progress Notes (Signed)
Request MD to assess breast for nodules to bilateral breast. Particularly the Rt. Areola is of concern. Please see my Lactation note.

## 2015-11-30 NOTE — Progress Notes (Signed)
Epidural catheter still in pt's back.  Dr Gentry Roch notified and permission received to D/C

## 2015-11-30 NOTE — Progress Notes (Signed)
Post Partum Day 2  Subjective:  Robin Price is a 30 y.o. G1P1001 [redacted]w[redacted]d s/p C- section, POD #1.  No acute events overnight.  Pt denies problems with ambulating, voiding or po intake.  She denies nausea or vomiting.  Pain is well controlled.  She has had flatus. She has had bowel movement.  Lochia Moderate.  Plan for birth control is condoms.  Method of Feeding: breast feeding. Foley catheter removed this morning.   Objective: BP 100/54 mmHg  Pulse 93  Temp(Src) 97.3 F (36.3 C) (Oral)  Resp 18  Ht  (1.549 m)  Wt 162 lb (73.483 kg)  BMI 30.63 kg/m2  SpO2 95%  Breastfeeding? Unknown  Physical Exam:  General: alert, cooperative and no distress Lochia:normal flow Chest: CTAB Heart: RRR no m/r/g Abdomen: +BS, soft, appropriately tender, fundus firm at/below umbilicus, bandage over lower abdomen, no obvious erythema  Uterine Fundus: firm DVT Evaluation: No evidence of DVT seen on physical exam. Extremities: trace edema   Recent Labs  11/28/15 0725  HGB 12.3  HCT 34.9*    Assessment/Plan:  ASSESSMENT: Robin Price is a 30 y.o. G1P1001 [redacted]w[redacted]d ppd #1 s/p C- Section, POD #1,  doing well.   Plan for discharge tomorrow, Breastfeeding and Lactation consult   LOS: 2 days   Beaulah Dinning 11/30/2015, 8:09 AM   OB fellow attestation Post Partum Day 2/POD#2 I have seen and examined this patient and agree with above documentation in the resident's note.   Robin Price is a 30 y.o. G1P1001 s/p pLTCS.  Pt denies problems with ambulating, voiding or po intake. Pain is well controlled.  Plan for birth control is condoms.  Method of Feeding: breast/bottle  PE:  BP 116/66 mmHg  Pulse 94  Temp(Src) 98 F (36.7 C) (Oral)  Resp 18  Ht  (1.549 m)  Wt 162 lb (73.483 kg)  BMI 30.63 kg/m2  SpO2 96%  Breastfeeding? Unknown Gen: well appearing Heart: reg rate Lungs: normal WOB Fundus firm Ext: soft, no pain, no edema  Plan for  discharge: today see summary   Federico Flake, MD 3:16 PM

## 2015-11-30 NOTE — Lactation Note (Signed)
This note was copied from a baby's chart. Lactation Consultation Note Mom stated she had just BF the baby for 12 minutes to Lt. Breast. Assessed breast d/t LC stated had lumps in breast. Mom has Hx: of cyst in 2004 removed. Mom has multiple lumps in breast. One of concern is to the Rt. Breast areola to Rt. Outer areola. Very large nodule to upper Rt. Outer aspect of areola, pea size nodule below Lg. Nodule, then smaller than pea size nodule on outer edge on areola.  Mom has lumps in Lt. Breast as well could be glands w/colostrum. Request MD to assess on next visit. Discussed BF and encouraged STS and to BF longer than 12 minutes if possible.  Patient Name: Robin Price ZOXWR'U Date: 11/30/2015 Reason for consult: Follow-up assessment   Maternal Data    Feeding Feeding Type: Breast Fed Length of feed: 12 min  LATCH Score/Interventions Latch: Repeated attempts needed to sustain latch, nipple held in mouth throughout feeding, stimulation needed to elicit sucking reflex. Intervention(s): Breast massage;Breast compression     Type of Nipple: Flat Intervention(s): Hand pump  Comfort (Breast/Nipple): Soft / non-tender           Lactation Tools Discussed/Used Tools: Nipple Shields Nipple shield size: 20 Breast pump type: Manual   Consult Status Consult Status: Follow-up Date: 11/30/15 Follow-up type: In-patient    [name], Diamond Nickel 11/30/2015, 3:26 AM

## 2015-11-30 NOTE — Progress Notes (Signed)
Called to removed epidural. Removed with tip intact. Nurse made aware. Had been previously charted that it had been removed.

## 2015-11-30 NOTE — Lactation Note (Signed)
This note was copied from a baby's chart. Lactation Consultation Note  Patient Name: Robin Price ZOXWR'U Date: 11/30/2015 Reason for consult: Follow-up assessment with this mom and baby, now 81 hours old, and at 5.4 % weight loss, and 1 void since birth. I called the pediatrician, Dr. Denny Levy, and she asked that formula supplementation begin every 3 hours, 10-20 ml's  . Marland Kitchen Mom is using a 20 nipple shield, but baby sleepy at 1430, but at 1530, he latched and suckled. I told mom to look for colostrum in the shield, after a feeding. Mom has small drops of colostrum with hand expression. The baby appears jaundiced.  I also told Dr. Jennette Kettle that the baby seems to have a tongue that sits far back in his mouth, does not extend well beyond the gum line -  possible tongue tie. I did not share this information with the family -  I reviewed the plan with the 3-11- RN, Marcelino Duster, and she will also get the mom started in pumping with DEP.    Maternal Data    Feeding Feeding Type: Breast Fed  LATCH Score/Interventions Latch: Too sleepy or reluctant, no latch achieved, no sucking elicited. Intervention(s): Skin to skin;Teach feeding cues;Waking techniques Intervention(s): Adjust position;Assist with latch  Audible Swallowing: None  Type of Nipple: Flat Intervention(s): Double electric pump;Hand pump (20 nipple shield)  Comfort (Breast/Nipple): Soft / non-tender     Hold (Positioning): Assistance needed to correctly position infant at breast and maintain latch.  LATCH Score: 4  Lactation Tools Discussed/Used     Consult Status Consult Status: Follow-up Date: 12/01/15 Follow-up type: In-patient    Alfred Levins 11/30/2015, 4:34 PM

## 2015-12-01 DIAGNOSIS — Z98891 History of uterine scar from previous surgery: Secondary | ICD-10-CM

## 2015-12-01 MED ORDER — OXYCODONE-ACETAMINOPHEN 5-325 MG PO TABS: 1.0000 | ORAL_TABLET | ORAL | Status: AC | PRN

## 2015-12-01 MED ORDER — IBUPROFEN 600 MG PO TABS: 600.0000 mg | ORAL_TABLET | Freq: Four times a day (QID) | ORAL | Status: AC

## 2015-12-01 NOTE — Discharge Instructions (Signed)
Iron-Rich Diet Iron is a mineral that helps your body to produce hemoglobin. Hemoglobin is a protein in your red blood cells that carries oxygen to your body's tissues. Eating too little iron may cause you to feel weak and tired, and it can increase your risk for infection. Eating enough iron is necessary for your body's metabolism, muscle function, and nervous system. Iron is naturally found in many foods. It can also be added to foods or fortified in foods. There are two types of dietary iron:  Heme iron. Heme iron is absorbed by the body more easily than nonheme iron. Heme iron is found in meat, poultry, and fish.  Nonheme iron. Nonheme iron is found in dietary supplements, iron-fortified grains, beans, and vegetables. You may need to follow an iron-rich diet if:  You have been diagnosed with iron deficiency or iron-deficiency anemia.  You have a condition that prevents you from absorbing dietary iron, such as:  Infection in your intestines.  Celiac disease. This involves long-lasting (chronic) inflammation of your intestines.  You do not eat enough iron.  You eat a diet that is high in foods that impair iron absorption.  You have lost a lot of blood.  You have heavy bleeding during your menstrual cycle.  You are pregnant. WHAT IS MY PLAN? Your health care provider may help you to determine how much iron you need per day based on your condition. Generally, when a person consumes sufficient amounts of iron in the diet, the following iron needs are met:  Men.  14-18 years old: 11 mg per day.  19-50 years old: 8 mg per day.  Women.   14-18 years old: 15 mg per day.  19-50 years old: 18 mg per day.  Over 50 years old: 8 mg per day.  Pregnant women: 27 mg per day.  Breastfeeding women: 9 mg per day. WHAT DO I NEED TO KNOW ABOUT AN IRON-RICH DIET?  Eat fresh fruits and vegetables that are high in vitamin C along with foods that are high in iron. This will help increase  the amount of iron that your body absorbs from food, especially with foods containing nonheme iron. Foods that are high in vitamin C include oranges, peppers, tomatoes, and mango.  Take iron supplements only as directed by your health care provider. Overdose of iron can be life-threatening. If you were prescribed iron supplements, take them with orange juice or a vitamin C supplement.  Cook foods in pots and pans that are made from iron.   Eat nonheme iron-containing foods alongside foods that are high in heme iron. This helps to improve your iron absorption.   Certain foods and drinks contain compounds that impair iron absorption. Avoid eating these foods in the same meal as iron-rich foods or with iron supplements. These include:  Coffee, black tea, and red wine.  Milk, dairy products, and foods that are high in calcium.  Beans, soybeans, and peas.  Whole grains.  When eating foods that contain both nonheme iron and compounds that impair iron absorption, follow these tips to absorb iron better.   Soak beans overnight before cooking.  Soak whole grains overnight and drain them before using.  Ferment flours before baking, such as using yeast in bread dough. WHAT FOODS CAN I EAT? Grains Iron-fortified breakfast cereal. Iron-fortified whole-wheat bread. Enriched rice. Sprouted grains. Vegetables Spinach. Potatoes with skin. Green peas. Broccoli. Red and green bell peppers. Fermented vegetables. Fruits Prunes. Raisins. Oranges. Strawberries. Mango. Grapefruit. Meats and Other Protein   Sources Beef liver. Oysters. Beef. Shrimp. Turkey. Chicken. Tuna. Sardines. Chickpeas. Nuts. Tofu. Beverages Tomato juice. Fresh orange juice. Prune juice. Hibiscus tea. Fortified instant breakfast shakes. Condiments Tahini. Fermented soy sauce. Sweets and Desserts Black-strap molasses.  Other Wheat germ. The items listed above may not be a complete list of recommended foods or  beverages. Contact your dietitian for more options. WHAT FOODS ARE NOT RECOMMENDED? Grains Whole grains. Bran cereal. Bran flour. Oats. Vegetables Artichokes. Brussels sprouts. Kale. Fruits Blueberries. Raspberries. Strawberries. Figs. Meats and Other Protein Sources Soybeans. Products made from soy protein. Dairy Milk. Cream. Cheese. Yogurt. Cottage cheese. Beverages Coffee. Black tea. Red wine. Sweets and Desserts Cocoa. Chocolate. Ice cream. Other Basil. Oregano. Parsley. The items listed above may not be a complete list of foods and beverages to avoid. Contact your dietitian for more information.   This information is not intended to replace advice given to you by your health care provider. Make sure you discuss any questions you have with your health care provider.   Document Released: 05/14/2005 Document Revised: 10/21/2014 Document Reviewed: 04/27/2014 [employer] Interactive Patient Education 2016 [employer]  

## 2015-12-01 NOTE — Discharge Summary (Signed)
OB Discharge Summary     Patient Name: Robin Price DOB: [birth year] MRN: [id number]  Date of admission: 11/28/2015 Delivering MD: Shonna Chock [place]   Date of discharge: 12/01/2015  Admitting diagnosis: INDUCTION Intrauterine pregnancy: [redacted]w[redacted]d     Secondary diagnosis:  Principal Problem:   S/P primary low transverse C-section for NRFHT Active Problems:   Post term pregnancy  Additional problems: None     Discharge diagnosis: Term Pregnancy Delivered                                                                                                Post partum procedures:None  Augmentation: AROM, Pitocin and Cytotec  Complications: None  Hospital course:  Induction of Labor With Cesarean Section  30 y.o. yo G1P1001 at [redacted]w[redacted]d was admitted to the hospital 11/28/2015 for induction of labor. Patient had a labor course significant for failed IOL. The patient went for cesarean section due to Non-Reassuring FHR, and delivered a Viable infant, APGARS 2 and 9. Membrane Rupture Time/Date:)1:40 AM ,11/29/2015  Details of operation can be found in separate operative Note.  Patient had an uncomplicated postpartum course. She is ambulating, tolerating a regular diet, passing flatus, and urinating well.  Patient is discharged home in stable condition on 12/01/2015.               Physical exam  Filed Vitals:   11/30/15 0400 11/30/15 0840 11/30/15 1757 12/01/15 0712  BP: 100/54 108/62 129/77 116/66  Pulse: 93 105 111 94  Temp: 97.3 F (36.3 C) 98.3 F (36.8 C) 98.1 F (36.7 C) 98 F (36.7 C)  TempSrc: Oral  Oral Oral  Resp: Height:      Weight:      SpO2: 95% 96%     General: alert, cooperative and no distress Lochia: appropriate Uterine Fundus: firm Incision: Healing well with no significant drainage, No significant erythema, Dressing is clean, dry, and intact DVT Evaluation: No evidence of DVT seen on physical exam. Labs: Lab Results  Component Value Date   WBC  11.2* 11/30/2015   HGB 9.5* 11/30/2015   HCT 28.1* 11/30/2015   MCV 91.5 11/30/2015   PLT 122* 11/30/2015   CMP Latest Ref Rng 11/24/2015  Glucose 65 - 99 mg/dL 79  BUN 6 - 20 mg/dL 8  Creatinine 4.09 - 8.11 mg/dL 9.14(N)  Sodium 829 - 562 mmol/L 135  Potassium 3.5 - 5.1 mmol/L 3.6  Chloride 101 - 111 mmol/L 106  CO2 22 - 32 mmol/L 22  Calcium 8.9 - 10.3 mg/dL 8.9  Total Protein 6.5 - 8.1 g/dL 6.3(L)  Total Bilirubin 0.3 - 1.2 mg/dL 0.6  Alkaline Phos 38 - 126 U/L 285(H)  AST 15 - 41 U/L 23  ALT 14 - 54 U/L 12(L)    Discharge instruction: per After Visit Summary and "Baby and Me Booklet".  After visit meds:    Medication List    TAKE these medications        ibuprofen 600 MG tablet  Commonly known as:  ADVIL,MOTRIN  Take 1 tablet (600 mg total) by  mouth every 6 (six) hours.     oxyCODONE-acetaminophen 5-325 MG tablet  Commonly known as:  PERCOCET/ROXICET  Take 1 tablet by mouth every 4 (four) hours as needed (pain scale 4-7).     prenatal multivitamin Tabs tablet  Take 1 tablet by mouth daily at 12 noon.        Diet: routine diet  Activity: Advance as tolerated. Pelvic rest for 6 weeks.   Outpatient follow up:2 weeks0 WOC for wound check; 6 weeks for routine postpartum care Follow up Appt:Future Appointments Date Time Provider Department Center  12/06/2015 2:30 PM [facility] CONSULTANT [facility] None   Follow up Visit:No Follow-up on file.  Postpartum contraception: Condoms  Newborn Data: Live born female  Birth Weight: 7 lb 6.7 oz (3365 g) APGAR: 2, 9  Baby Feeding: Bottle and Breast Disposition:home with mother   12/01/2015 Federico Flake, MD

## 2015-12-01 NOTE — Lactation Note (Addendum)
This note was copied from a baby's chart. Lactation Consultation Note  Patient Name: Robin Price [id number] Date: 12/01/2015 Reason for consult: Follow-up assessment;Infant weight loss (7% weight loss , per Pedis MD to supplement )  Per mom baby recently breast fed and supplement after wards with formula per Pedis MD.  @ consult LC reviewed basics - re-sized Nipple Shield and LC felt the #20 NS was boarder line tight , especially when the milk  Comes in . #24 NS fit better. LC had mom return the Demo and she did well after being instructed and also how to use the curved tip  Syringe to instill  EBM or formula for and appetizer. Feed on both breast , especially due to breast being fuller and heavier today and  supplement afterwards, and post pump 10 -15 mins to increase volume due to using a Nipple Shield. Mom aware of the need to due Extra pumping and to feed EBM back to baby. Mom denies sore nipples , sore nipple and engorgement prevention and tx. Reviewed.  Mom active with [place] [facility] . LC had mom call WIC and [facility] faxed a referral for [facility] loaner request , waiting for a return call.  Mom receptive to recommendation to return 2/22  pm for [facility] O/P appt. Reminder given.   Evans Lance from [facility] called back and made appt. With mom to pick up her pump this afternoon.      Maternal Data Has patient been taught Hand Expression?: Yes  Feeding Feeding Type:  (babhy recently breast fed and supplement with a bottle ) Nipple Type: Slow - flow Length of feed: 20 min  LATCH Score/Interventions/ This Latch score is from [employer] RN  Latch: Grasps breast easily, tongue down, lips flanged, rhythmical sucking.  Audible Swallowing: Spontaneous and intermittent  Type of Nipple: Everted at rest and after stimulation Intervention(s): Double electric pump (shield)  Comfort (Breast/Nipple): Filling, red/small blisters or bruises, mild/mod discomfort  Problem noted: Mild/Moderate  discomfort Interventions (Mild/moderate discomfort):  (EBM, olive or coconut oil instead of lanolin)  Hold (Positioning): Assistance needed to correctly position infant at breast and maintain latch. Intervention(s): Breastfeeding basics reviewed  LATCH Score: 8  Lactation Tools Discussed/Used Tools: Nipple Shields Nipple shield size: 20 WIC Program: Yes (LC sent a [facility] referral Faxed and mom called )   Consult Status Consult Status: Follow-up Date: 12/06/15 ([facility], at 230 pm , appt. reminder given ) Follow-up type: Out-patient    Kathrin Greathouse 12/01/2015, 12:02 PM

## 2015-12-01 NOTE — Progress Notes (Signed)
Subjective: Postpartum Day 2: Cesarean Delivery Patient reports tolerating PO, + flatus, + BM and no problems voiding. Improving abdominal pain.   Objective: Vital signs in last 24 hours: Temp:  [98 F (36.7 C)-98.3 F (36.8 C)] 98 F (36.7 C) (02/17 0712) Pulse Rate:  [94-111] 94 (02/17 0712) Resp:  [18] 18 (02/17 0712) BP: (108-129)/(62-77) 116/66 mmHg (02/17 0712) SpO2:  [96 %] 96 % (02/16 0840)  Physical Exam:  General: alert, cooperative and no distress Lochia: appropriate Uterine Fundus: firm Incision: Bandage over incision, clean, dry, and intact DVT Evaluation: No evidence of DVT seen on physical exam. No cords or calf tenderness.   Recent Labs  11/30/15 0642  HGB 9.5*  HCT 28.1*    Assessment/Plan: Status post Cesarean section. Doing well postoperatively.  Continue current care.  Beaulah Dinning 12/01/2015, 7:49 AM  I have seen and examined this patient and agree the above assessment.  Respiratory effort normal, lochia appropriate, legs negative,  pain level normal.  CRESENZO-DISHMAN,Meril Dray 12/05/2015 11:37 AM  ]

## 2015-12-06 ENCOUNTER — Ambulatory Visit (HOSPITAL_COMMUNITY): Admission: AD | Admit: 2015-12-06 | Payer: Medicaid Other | Source: Ambulatory Visit | Admitting: Family Medicine

## 2018-11-09 ENCOUNTER — Encounter (HOSPITAL_BASED_OUTPATIENT_CLINIC_OR_DEPARTMENT_OTHER): Payer: Self-pay | Admitting: *Deleted

## 2018-11-09 ENCOUNTER — Emergency Department (HOSPITAL_BASED_OUTPATIENT_CLINIC_OR_DEPARTMENT_OTHER)
Admission: EM | Admit: 2018-11-09 | Discharge: 2018-11-09 | Disposition: A | Discharge status: Home / Self Care | Payer: Self-pay | Attending: Emergency Medicine | Admitting: Emergency Medicine

## 2018-11-09 ENCOUNTER — Other Ambulatory Visit: Payer: Self-pay

## 2018-11-09 DIAGNOSIS — Z79899 Other long term (current) drug therapy: Secondary | ICD-10-CM | POA: Insufficient documentation

## 2018-11-09 DIAGNOSIS — R1033 Periumbilical pain: Secondary | ICD-10-CM | POA: Insufficient documentation

## 2018-11-09 DIAGNOSIS — J45909 Unspecified asthma, uncomplicated: Secondary | ICD-10-CM | POA: Insufficient documentation

## 2018-11-09 LAB — URINALYSIS, ROUTINE W REFLEX MICROSCOPIC
Bilirubin Urine: NEGATIVE
Glucose, UA: NEGATIVE mg/dL
Hgb urine dipstick: NEGATIVE
Ketones, ur: NEGATIVE mg/dL
LEUKOCYTES UA: NEGATIVE
NITRITE: NEGATIVE
PROTEIN: NEGATIVE mg/dL
SPECIFIC GRAVITY, URINE: 1.01 (ref 1.005–1.030)
pH: 6.5 (ref 5.0–8.0)

## 2018-11-09 LAB — COMPREHENSIVE METABOLIC PANEL
ALBUMIN: 4.5 g/dL (ref 3.5–5.0)
ALT: 17 U/L (ref 0–44)
ANION GAP: 7 (ref 5–15)
AST: 24 U/L (ref 15–41)
Alkaline Phosphatase: 57 U/L (ref 38–126)
BILIRUBIN TOTAL: 0.5 mg/dL (ref 0.3–1.2)
BUN: 8 mg/dL (ref 6–20)
CHLORIDE: 104 mmol/L (ref 98–111)
CO2: 27 mmol/L (ref 22–32)
Calcium: 9.5 mg/dL (ref 8.9–10.3)
Creatinine, Ser: 0.5 mg/dL (ref 0.44–1.00)
GFR calc Af Amer: >60 mL/min (ref >60)
GFR calc non Af Amer: >60 mL/min (ref >60)
Glucose, Bld: 101 mg/dL — ABNORMAL HIGH (ref 70–99)
POTASSIUM: 3.5 mmol/L (ref 3.5–5.1)
SODIUM: 138 mmol/L (ref 135–145)
TOTAL PROTEIN: 7.8 g/dL (ref 6.5–8.1)

## 2018-11-09 LAB — CBC
HEMATOCRIT: 42.3 % (ref 36.0–46.0)
HEMOGLOBIN: 13.4 g/dL (ref 12.0–15.0)
MCH: 28.3 pg (ref 26.0–34.0)
MCHC: 31.7 g/dL (ref 30.0–36.0)
MCV: 89.2 fL (ref 80.0–100.0)
Platelets: 291 10*3/uL (ref 150–400)
RBC: 4.74 MIL/uL (ref 3.87–5.11)
RDW: 12 % (ref 11.5–15.5)
WBC: 9.2 10*3/uL (ref 4.0–10.5)
nRBC: 0 % (ref 0.0–0.2)

## 2018-11-09 LAB — PREGNANCY, URINE: PREG TEST UR: NEGATIVE

## 2018-11-09 LAB — LIPASE, BLOOD: LIPASE: 33 U/L (ref 11–51)

## 2018-11-09 MED ORDER — POLYETHYLENE GLYCOL 3350 17 G PO PACK: 17.0000 g | PACK | Freq: Every day | ORAL | 0 refills | Status: AC

## 2018-11-09 MED ORDER — SODIUM CHLORIDE 0.9% FLUSH
3.0000 mL | Freq: Once | INTRAVENOUS | Status: DC
  Filled 2018-11-09: qty 3

## 2018-11-09 NOTE — ED Provider Notes (Signed)
[facility] EMERGENCY DEPARTMENT Provider Note   CSN: [id number] Arrival date & time: 11/09/18  1530     History   Chief Complaint Chief Complaint  Patient presents with  . Abdominal Pain    HPI Robin Price is a 33 y.o. female.  The history is provided by the patient.  Abdominal Pain  Pain location:  Periumbilical Pain quality: aching   Pain radiates to:  Does not radiate Pain severity:  Mild Onset quality:  Gradual Timing:  Intermittent Progression:  Waxing and waning Chronicity:  Recurrent Context: previous surgery   Context: not recent sexual activity, not sick contacts and not trauma   Relieved by:  Nothing Worsened by:  Palpation and bowel movements Associated symptoms: constipation   Associated symptoms: no anorexia, no belching, no chest pain, no chills, no cough, no diarrhea, no dysuria, no fever, no flatus, no hematuria, no melena, no nausea, no shortness of breath, no sore throat, no vaginal bleeding, no vaginal discharge and no vomiting   Risk factors: not pregnant     Past Medical History:  Diagnosis Date  . Asthma   . Pregnancy induced hypertension     Patient Active Problem List   Diagnosis Date Noted  . S/P primary low transverse C-section for NRFHT 12/01/2015  . Post term pregnancy 11/28/2015    Past Surgical History:  Procedure Laterality Date  . BREAST LUMPECTOMY     11th grade L breast  . CESAREAN SECTION N/A 11/29/2015   Procedure: CESAREAN SECTION;  Surgeon: Tereso NewcomerUgonna A Anyanwu, MD;  Location: [facility];  Service: Obstetrics;  Laterality: N/A;     OB History    Gravida  1   Para  1   Term  1   Preterm      AB      Living  1     SAB      TAB      Ectopic      Multiple  0   Live Births  1            Home Medications    Prior to Admission medications   Medication Sig Start Date End Date Taking? Authorizing Provider  ibuprofen (ADVIL,MOTRIN) 600 MG tablet Take 1 tablet (600 mg total) by mouth  every 6 (six) hours. 12/01/15   Federico FlakeNewton, Kimberly Niles, MD  oxyCODONE-acetaminophen (PERCOCET/ROXICET) 5-325 MG tablet Take 1 tablet by mouth every 4 (four) hours as needed (pain scale 4-7). 12/01/15   Federico FlakeNewton, Kimberly Niles, MD  polyethylene glycol [facility](MIRALAX / Ethelene HalGLYCOLAX) packet Take 17 g by mouth daily for 14 doses. 11/09/18 11/23/18  Virgina Norfolkuratolo, Nevena Rozenberg, DO  Prenatal Vit-Fe Fumarate-FA (PRENATAL MULTIVITAMIN) TABS tablet Take 1 tablet by mouth daily at 12 noon.    [provider]    Family History Family History  Problem Relation Age of Onset  . Diabetes Mother     Social History Social History   Tobacco Use  . Smoking status: Never Smoker  . Smokeless tobacco: Never Used  Substance Use Topics  . Alcohol use: No  . Drug use: No     Allergies   Patient has no known allergies.   Review of Systems Review of Systems  Constitutional: Negative for chills and fever.  HENT: Negative for ear pain and sore throat.   Eyes: Negative for pain and visual disturbance.  Respiratory: Negative for cough and shortness of breath.   Cardiovascular: Negative for chest pain and palpitations.  Gastrointestinal: Positive for abdominal pain and  constipation. Negative for anorexia, diarrhea, flatus, melena, nausea and vomiting.  Genitourinary: Negative for dysuria, hematuria, vaginal bleeding and vaginal discharge.  Musculoskeletal: Negative for arthralgias and back pain.  Skin: Negative for color change and rash.  Neurological: Negative for seizures and syncope.  All other systems reviewed and are negative.    Physical Exam Updated Vital Signs BP 117/73   Pulse 82   Temp 98.2 F (36.8 C) (Oral)   Resp 18   Ht 5\' 1"  (1.549 m)   Wt 68 kg   LMP 10/30/2018   SpO2 100%   BMI 28.34 kg/m   Physical Exam Vitals signs and nursing note reviewed.  Constitutional:      General: She is not in acute distress.    Appearance: She is well-developed.  HENT:     Head: Normocephalic and atraumatic.   Eyes:     Extraocular Movements: Extraocular movements intact.     Conjunctiva/sclera: Conjunctivae normal.     Pupils: Pupils are equal, round, and reactive to light.  Neck:     Musculoskeletal: Neck supple.  Cardiovascular:     Rate and Rhythm: Normal rate and regular rhythm.     Heart sounds: Normal heart sounds. No murmur.  Pulmonary:     Effort: Pulmonary effort is normal. No respiratory distress.     Breath sounds: Normal breath sounds.  Abdominal:     General: Abdomen is flat.     Palpations: Abdomen is soft.     Tenderness: There is no abdominal tenderness.     Hernia: No hernia is present. There is no hernia in the umbilical area.  Skin:    General: Skin is warm and dry.     Capillary Refill: Capillary refill takes less than 2 seconds.  Neurological:     Mental Status: She is alert.      ED Treatments / Results  Labs (all labs ordered are listed, but only abnormal results are displayed) Labs Reviewed  COMPREHENSIVE METABOLIC PANEL - Abnormal; Notable for the following components:      Result Value   Glucose, Bld 101 (*)    All other components within normal limits  URINALYSIS, ROUTINE W REFLEX MICROSCOPIC  PREGNANCY, URINE  LIPASE, BLOOD  CBC    EKG None  Radiology No results found.  Procedures Procedures (including critical care time)  Medications Ordered in ED Medications  sodium chloride flush (NS) 0.9 % injection 3 mL (3 mLs Intravenous Not Given 11/09/18 1730)     Initial Impression / Assessment and Plan / ED Course  I have reviewed the triage vital signs and the nursing notes.  Pertinent labs & imaging results that were available during my care of the patient were reviewed by me and considered in my medical decision making (see chart for details).     Robin Price is a 33 year old female with no significant medical history who presents to the ED with periumbilical abdominal pain that is been ongoing for the last several weeks.   Pain is intermittent.  Patient with normal vitals.  No fever.  Patient has had pain that is worse with increased abdominal pressure such as having bowel movements, lifting heavy things.  No obvious hernia on exam.  No concern for strangulated or incarcerated hernia based on exam.  Patient has had prior C-section.  Patient had lab work that showed no significant anemia, leukocytosis, electrolyte abnormality, kidney injury.  On exam patient has no specific tenderness.  No peritonitis.  There is no pain  in the right lower quadrant and given history and physical doubt appendicitis or ovarian pathology.  Overall is well-appearing.  States that she does have some constipation at times.  Will prescribe MiraLAX.  Suspect possible hernia type pain but as previously mentioned no concern for surgical emergency at this time.  Patient denies any vaginal bleeding, vaginal discharge.  No symptoms concerning for bowel obstruction.  She is able to eat and drink without any issues.  She could possibly have some scar tissue pain as well.  Recommend follow-up with primary care doctor and OB/GYN.  Discharged from ED in good condition.  Given return precautions.  This chart was dictated using voice recognition software.  Despite best efforts to proofread,  errors can occur which can change the documentation meaning.   Final Clinical Impressions(s) / ED Diagnoses   Final diagnoses:  Periumbilical abdominal pain    ED Discharge Orders         Ordered    polyethylene glycol (MIRALAX / GLYCOLAX) packet  Daily     11/09/18 1728           Virgina NorfolkCuratolo, Tamana Hatfield, DO 11/09/18 1738

## 2018-11-09 NOTE — ED Triage Notes (Signed)
Umbilical pain for a week.

## 2018-11-09 NOTE — ED Notes (Signed)
ED Provider at bedside. 

## 2019-08-13 ENCOUNTER — Other Ambulatory Visit: Payer: Self-pay

## 2019-08-13 ENCOUNTER — Emergency Department (HOSPITAL_BASED_OUTPATIENT_CLINIC_OR_DEPARTMENT_OTHER): Payer: Self-pay

## 2019-08-13 ENCOUNTER — Emergency Department (HOSPITAL_BASED_OUTPATIENT_CLINIC_OR_DEPARTMENT_OTHER)
Admission: EM | Admit: 2019-08-13 | Discharge: 2019-08-14 | Disposition: A | Discharge status: Home / Self Care | Payer: Self-pay | Attending: Emergency Medicine | Admitting: Emergency Medicine

## 2019-08-13 ENCOUNTER — Encounter (HOSPITAL_BASED_OUTPATIENT_CLINIC_OR_DEPARTMENT_OTHER): Payer: Self-pay | Admitting: *Deleted

## 2019-08-13 DIAGNOSIS — J45909 Unspecified asthma, uncomplicated: Secondary | ICD-10-CM | POA: Insufficient documentation

## 2019-08-13 DIAGNOSIS — R1031 Right lower quadrant pain: Secondary | ICD-10-CM | POA: Insufficient documentation

## 2019-08-13 DIAGNOSIS — R109 Unspecified abdominal pain: Secondary | ICD-10-CM

## 2019-08-13 DIAGNOSIS — Z79899 Other long term (current) drug therapy: Secondary | ICD-10-CM | POA: Insufficient documentation

## 2019-08-13 LAB — URINALYSIS, MICROSCOPIC (REFLEX)

## 2019-08-13 LAB — URINALYSIS, ROUTINE W REFLEX MICROSCOPIC
Bilirubin Urine: NEGATIVE
Glucose, UA: NEGATIVE mg/dL
Ketones, ur: NEGATIVE mg/dL
Leukocytes,Ua: NEGATIVE
Nitrite: NEGATIVE
Protein, ur: NEGATIVE mg/dL
Specific Gravity, Urine: <1.005 — ABNORMAL LOW (ref 1.005–1.030)
pH: 6.5 (ref 5.0–8.0)

## 2019-08-13 LAB — PREGNANCY, URINE: Preg Test, Ur: NEGATIVE

## 2019-08-13 NOTE — ED Triage Notes (Signed)
Right lower quadrant pain that comes and goes. It started 2 days ago. Hx of ovarian cyst.

## 2019-08-14 ENCOUNTER — Encounter (HOSPITAL_BASED_OUTPATIENT_CLINIC_OR_DEPARTMENT_OTHER): Payer: Self-pay | Admitting: Emergency Medicine

## 2019-08-14 MED ORDER — KETOROLAC TROMETHAMINE 60 MG/2ML IM SOLN
30.0000 mg | Freq: Once | INTRAMUSCULAR | Status: AC
  Administered 2019-08-14: 01:00:00 30 mg via INTRAMUSCULAR
  Filled 2019-08-14: qty 2

## 2019-08-14 MED ORDER — NAPROXEN 375 MG PO TABS: 375.0000 mg | ORAL_TABLET | Freq: Two times a day (BID) | ORAL | 0 refills | Status: AC

## 2019-08-14 NOTE — ED Notes (Signed)
Patient verbalizes understanding of discharge instructions. Opportunity for questioning and answers were provided. Armband removed by staff, pt discharged from ED.  

## 2019-08-14 NOTE — ED Provider Notes (Signed)
[facility] EMERGENCY DEPARTMENT Provider Note   CSN: [id number] Arrival date & time: 08/13/19  2127     History   Chief Complaint Chief Complaint  Patient presents with  . Abdominal Pain    HPI Robin Price is a 33 y.o. female.     The history is provided by the patient.  Abdominal Pain Pain location: right groin. Pain quality: aching   Pain radiates to:  Does not radiate Pain severity:  Moderate Onset quality:  Gradual Duration:  2 days Timing:  Intermittent Progression:  Waxing and waning Chronicity:  New Context: not alcohol use, not awakening from sleep, not diet changes, not eating, not laxative use, not medication withdrawal, not previous surgeries, not recent illness, not recent sexual activity, not recent travel, not retching, not sick contacts, not suspicious food intake and not trauma   Relieved by:  Nothing Ineffective treatments:  None tried Associated symptoms: no anorexia, no belching, no chest pain, no chills, no constipation, no cough, no diarrhea, no dysuria, no fatigue, no fever, no flatus, no hematemesis, no hematochezia, no hematuria, no melena, no nausea, no shortness of breath, no sore throat, no vaginal bleeding, no vaginal discharge and no vomiting   Risk factors: no alcohol abuse and not pregnant   Patient thinks it is an ovarian cyst.  No f/c/r.  No n/v/d.  No urinary symptoms.    Past Medical History:  Diagnosis Date  . Asthma   . Pregnancy induced hypertension     Patient Active Problem List   Diagnosis Date Noted  . S/P primary low transverse C-section for NRFHT 12/01/2015  . Post term pregnancy 11/28/2015    Past Surgical History:  Procedure Laterality Date  . BREAST LUMPECTOMY     11th grade L breast  . CESAREAN SECTION N/A 11/29/2015   Procedure: CESAREAN SECTION;  Surgeon: Tereso NewcomerUgonna A Anyanwu, MD;  Location: [facility];  Service: Obstetrics;  Laterality: N/A;     OB History    Gravida  1   Para  1   Term   1   Preterm      AB      Living  1     SAB      TAB      Ectopic      Multiple  0   Live Births  1            Home Medications    Prior to Admission medications   Medication Sig Start Date End Date Taking? Authorizing Provider  ibuprofen (ADVIL,MOTRIN) 600 MG tablet Take 1 tablet (600 mg total) by mouth every 6 (six) hours. 12/01/15   Federico FlakeNewton, Kimberly Niles, MD  oxyCODONE-acetaminophen (PERCOCET/ROXICET) 5-325 MG tablet Take 1 tablet by mouth every 4 (four) hours as needed (pain scale 4-7). 12/01/15   Federico FlakeNewton, Kimberly Niles, MD  Prenatal Vit-Fe Fumarate-FA (PRENATAL MULTIVITAMIN) TABS tablet Take 1 tablet by mouth daily at 12 noon.    [provider]    Family History Family History  Problem Relation Age of Onset  . Diabetes Mother     Social History Social History   Tobacco Use  . Smoking status: Never Smoker  . Smokeless tobacco: Never Used  Substance Use Topics  . Alcohol use: No  . Drug use: No     Allergies   Patient has no known allergies.   Review of Systems Review of Systems  Constitutional: Negative for chills, fatigue and fever.  HENT: Negative for sore throat.  Eyes: Negative for visual disturbance.  Respiratory: Negative for cough and shortness of breath.   Cardiovascular: Negative for chest pain.  Gastrointestinal: Positive for abdominal pain. Negative for anorexia, constipation, diarrhea, flatus, hematemesis, hematochezia, melena, nausea and vomiting.  Genitourinary: Negative for dysuria, hematuria, vaginal bleeding and vaginal discharge.  Musculoskeletal: Negative for arthralgias.  Neurological: Negative for dizziness.  Psychiatric/Behavioral: Negative for agitation.  All other systems reviewed and are negative.    Physical Exam Updated Vital Signs BP 117/71   Pulse 65   Temp 97.9 F (36.6 C) (Oral)   Resp 14   Ht 5\' 1"  (1.549 m)   Wt 66.2 kg   LMP 08/13/2019   SpO2 100%   BMI 27.59 kg/m   Physical Exam  Vitals signs and nursing note reviewed.  Constitutional:      General: She is not in acute distress. HENT:     Head: Normocephalic and atraumatic.     Nose: Nose normal.  Eyes:     Pupils: Pupils are equal, round, and reactive to light.  Neck:     Musculoskeletal: Normal range of motion and neck supple.  Cardiovascular:     Rate and Rhythm: Normal rate and regular rhythm.     Pulses: Normal pulses.     Heart sounds: Normal heart sounds.  Pulmonary:     Effort: Pulmonary effort is normal.     Breath sounds: Normal breath sounds.  Abdominal:     General: Abdomen is flat. Bowel sounds are normal.     Tenderness: There is no abdominal tenderness. There is no guarding.  Musculoskeletal: Normal range of motion.  Skin:    General: Skin is warm and dry.     Capillary Refill: Capillary refill takes less than 2 seconds.  Neurological:     General: No focal deficit present.     Mental Status: She is alert and oriented to person, place, and time.     Deep Tendon Reflexes: Reflexes normal.  Psychiatric:        Mood and Affect: Mood normal.        Behavior: Behavior normal.      ED Treatments / Results  Labs (all labs ordered are listed, but only abnormal results are displayed) Results for orders placed or performed during the hospital encounter of 08/13/19  Urinalysis, Routine w reflex microscopic  Result Value Ref Range   Color, Urine COLORLESS (A) YELLOW   APPearance CLEAR CLEAR   Specific Gravity, Urine <1.005 (L) 1.005 - 1.030   pH 6.5 5.0 - 8.0   Glucose, UA NEGATIVE NEGATIVE mg/dL   Hgb urine dipstick LARGE (A) NEGATIVE   Bilirubin Urine NEGATIVE NEGATIVE   Ketones, ur NEGATIVE NEGATIVE mg/dL   Protein, ur NEGATIVE NEGATIVE mg/dL   Nitrite NEGATIVE NEGATIVE   Leukocytes,Ua NEGATIVE NEGATIVE  Pregnancy, urine  Result Value Ref Range   Preg Test, Ur NEGATIVE NEGATIVE  Urinalysis, Microscopic (reflex)  Result Value Ref Range   RBC / HPF 11-20 0 - 5 RBC/hpf   WBC, UA  0-5 0 - 5 WBC/hpf   Bacteria, UA FEW (A) NONE SEEN   Squamous Epithelial / LPF 0-5 0 - 5   Ct Renal Stone Study  Result Date: 08/13/2019 CLINICAL DATA:  33 year old female with right lower quadrant abdominal pain. EXAM: CT ABDOMEN AND PELVIS WITHOUT CONTRAST TECHNIQUE: Multidetector CT imaging of the abdomen and pelvis was performed following the standard protocol without IV contrast. COMPARISON:  None. FINDINGS: Evaluation of this exam is limited in  the absence of intravenous contrast. Lower chest: The visualized lung bases are clear. No intra-abdominal free air or free fluid. Hepatobiliary: No focal liver abnormality is seen. No gallstones, gallbladder wall thickening, or biliary dilatation. Pancreas: Unremarkable. No pancreatic ductal dilatation or surrounding inflammatory changes. Spleen: Normal in size without focal abnormality. Adrenals/Urinary Tract: The adrenal glands, kidneys, visualized ureters, and urinary bladder appear unremarkable. Stomach/Bowel: There is no bowel obstruction or active inflammation. The appendix is normal. Vascular/Lymphatic: The abdominal aorta and IVC are grossly unremarkable on this noncontrast CT. No portal venous gas. There is no adenopathy. Reproductive: The uterus is anteverted and grossly unremarkable. There is a 3 cm left ovarian dominant follicle/corpus luteum. The right ovary is unremarkable. Other: Anterior pelvic wall C-section scar. Musculoskeletal: No acute or significant osseous findings. IMPRESSION: 1. No acute intra-abdominopelvic pathology. No hydronephrosis or nephrolithiasis. 2. No bowel obstruction or active inflammation.  Normal appendix. Electronically Signed   By: Anner Crete M.D.   On: 08/13/2019 23:27    Radiology Ct Renal Stone Study  Result Date: 08/13/2019 CLINICAL DATA:  33 year old female with right lower quadrant abdominal pain. EXAM: CT ABDOMEN AND PELVIS WITHOUT CONTRAST TECHNIQUE: Multidetector CT imaging of the abdomen and pelvis  was performed following the standard protocol without IV contrast. COMPARISON:  None. FINDINGS: Evaluation of this exam is limited in the absence of intravenous contrast. Lower chest: The visualized lung bases are clear. No intra-abdominal free air or free fluid. Hepatobiliary: No focal liver abnormality is seen. No gallstones, gallbladder wall thickening, or biliary dilatation. Pancreas: Unremarkable. No pancreatic ductal dilatation or surrounding inflammatory changes. Spleen: Normal in size without focal abnormality. Adrenals/Urinary Tract: The adrenal glands, kidneys, visualized ureters, and urinary bladder appear unremarkable. Stomach/Bowel: There is no bowel obstruction or active inflammation. The appendix is normal. Vascular/Lymphatic: The abdominal aorta and IVC are grossly unremarkable on this noncontrast CT. No portal venous gas. There is no adenopathy. Reproductive: The uterus is anteverted and grossly unremarkable. There is a 3 cm left ovarian dominant follicle/corpus luteum. The right ovary is unremarkable. Other: Anterior pelvic wall C-section scar. Musculoskeletal: No acute or significant osseous findings. IMPRESSION: 1. No acute intra-abdominopelvic pathology. No hydronephrosis or nephrolithiasis. 2. No bowel obstruction or active inflammation.  Normal appendix. Electronically Signed   By: Anner Crete M.D.   On: 08/13/2019 23:27    Procedures Procedures (including critical care time)  Medications Ordered in ED Medications  ketorolac (TORADOL) injection 30 mg (has no administration in time range)     Initial Impression / Assessment and Plan / ED Course   Exam and vitals are benign and reassuring.  No acute abnormality on CT scan.  NSAids and heat therapy.    Robin Price was evaluated in Emergency Department on 08/14/2019 for the symptoms described in the history of present illness. She was evaluated in the context of the global COVID-19 pandemic, which necessitated  consideration that the patient might be at risk for infection with the SARS-CoV-2 virus that causes COVID-19. Institutional protocols and algorithms that pertain to the evaluation of patients at risk for COVID-19 are in a state of rapid change based on information released by regulatory bodies including the CDC and federal and state organizations. These policies and algorithms were followed during the patient's care in the ED.   Final Clinical Impressions(s) / ED Diagnoses   Return for weakness, numbness, changes in vision or speech, fevers >100.4 unrelieved by medication, shortness of breath, intractable vomiting, or diarrhea, abdominal pain, Inability to tolerate liquids  or food, cough, altered mental status or any concerns. No signs of systemic illness or infection. The patient is nontoxic-appearing on exam and vital signs are within normal limits.   I have reviewed the triage vital signs and the nursing notes. Pertinent labs &imaging results that were available during my care of the patient were reviewed by me and considered in my medical decision making (see chart for details).  After history, exam, and medical workup I feel the patient has been appropriately medically screened and is safe for discharge home. Pertinent diagnoses were discussed with the patient. Patient was given return precautions   Madalaine Portier, MD 08/14/19 0117

## 2020-10-28 ENCOUNTER — Emergency Department (HOSPITAL_BASED_OUTPATIENT_CLINIC_OR_DEPARTMENT_OTHER): Payer: Self-pay

## 2020-10-28 ENCOUNTER — Encounter (HOSPITAL_BASED_OUTPATIENT_CLINIC_OR_DEPARTMENT_OTHER): Payer: Self-pay | Admitting: Emergency Medicine

## 2020-10-28 ENCOUNTER — Other Ambulatory Visit: Payer: Self-pay

## 2020-10-28 ENCOUNTER — Emergency Department (HOSPITAL_BASED_OUTPATIENT_CLINIC_OR_DEPARTMENT_OTHER)
Admission: EM | Admit: 2020-10-28 | Discharge: 2020-10-28 | Disposition: A | Discharge status: Home / Self Care | Payer: Self-pay | Attending: Emergency Medicine | Admitting: Emergency Medicine

## 2020-10-28 DIAGNOSIS — J45909 Unspecified asthma, uncomplicated: Secondary | ICD-10-CM | POA: Insufficient documentation

## 2020-10-28 DIAGNOSIS — N83201 Unspecified ovarian cyst, right side: Secondary | ICD-10-CM | POA: Insufficient documentation

## 2020-10-28 DIAGNOSIS — R1031 Right lower quadrant pain: Secondary | ICD-10-CM

## 2020-10-28 LAB — CBC
HCT: 41.7 % (ref 36.0–46.0)
Hemoglobin: 13.4 g/dL (ref 12.0–15.0)
MCH: 28.3 pg (ref 26.0–34.0)
MCHC: 32.1 g/dL (ref 30.0–36.0)
MCV: 88.2 fL (ref 80.0–100.0)
Platelets: 302 10*3/uL (ref 150–400)
RBC: 4.73 MIL/uL (ref 3.87–5.11)
RDW: 13.7 % (ref 11.5–15.5)
WBC: 6.7 10*3/uL (ref 4.0–10.5)
nRBC: 0 % (ref 0.0–0.2)

## 2020-10-28 LAB — URINALYSIS, ROUTINE W REFLEX MICROSCOPIC
Bilirubin Urine: NEGATIVE
Glucose, UA: NEGATIVE mg/dL
Hgb urine dipstick: NEGATIVE
Ketones, ur: NEGATIVE mg/dL
Leukocytes,Ua: NEGATIVE
Nitrite: NEGATIVE
Protein, ur: NEGATIVE mg/dL
Specific Gravity, Urine: 1.005 (ref 1.005–1.030)
pH: 8 (ref 5.0–8.0)

## 2020-10-28 LAB — LIPASE, BLOOD: Lipase: 35 U/L (ref 11–51)

## 2020-10-28 LAB — COMPREHENSIVE METABOLIC PANEL
ALT: 18 U/L (ref 0–44)
AST: 20 U/L (ref 15–41)
Albumin: 4.4 g/dL (ref 3.5–5.0)
Alkaline Phosphatase: 57 U/L (ref 38–126)
Anion gap: 10 (ref 5–15)
BUN: 11 mg/dL (ref 6–20)
CO2: 26 mmol/L (ref 22–32)
Calcium: 9.1 mg/dL (ref 8.9–10.3)
Chloride: 102 mmol/L (ref 98–111)
Creatinine, Ser: 0.73 mg/dL (ref 0.44–1.00)
GFR, Estimated: >60 mL/min (ref >60)
Glucose, Bld: 83 mg/dL (ref 70–99)
Potassium: 3.7 mmol/L (ref 3.5–5.1)
Sodium: 138 mmol/L (ref 135–145)
Total Bilirubin: 0.4 mg/dL (ref 0.3–1.2)
Total Protein: 7.6 g/dL (ref 6.5–8.1)

## 2020-10-28 LAB — PREGNANCY, URINE: Preg Test, Ur: NEGATIVE

## 2020-10-28 MED ORDER — IOHEXOL 300 MG/ML  SOLN
100.0000 mL | Freq: Once | INTRAMUSCULAR | Status: AC
  Administered 2020-10-28: 100 mL via INTRAVENOUS

## 2020-10-28 MED ORDER — SODIUM CHLORIDE 0.9 % IV BOLUS
1000.0000 mL | Freq: Once | INTRAVENOUS | Status: AC
  Administered 2020-10-28: 1000 mL via INTRAVENOUS

## 2020-10-28 MED ORDER — MORPHINE SULFATE (PF) 4 MG/ML IV SOLN
4.0000 mg | Freq: Once | INTRAVENOUS | Status: AC
  Administered 2020-10-28: 4 mg via INTRAVENOUS
  Filled 2020-10-28: qty 1

## 2020-10-28 MED ORDER — HYDROCODONE-ACETAMINOPHEN 5-325 MG PO TABS: 1.0000 | ORAL_TABLET | ORAL | 0 refills | Status: AC | PRN

## 2020-10-28 MED ORDER — ONDANSETRON HCL 4 MG/2ML IJ SOLN
4.0000 mg | Freq: Once | INTRAMUSCULAR | Status: AC
  Administered 2020-10-28: 4 mg via INTRAVENOUS
  Filled 2020-10-28: qty 2

## 2020-10-28 NOTE — ED Provider Notes (Signed)
[facility] EMERGENCY DEPARTMENT Provider Note   CSN: [id number] Arrival date & time: 10/28/20  1223     History Chief Complaint  Patient presents with  . Abdominal Pain    Robin Price is a 35 y.o. female with no significant past medical history who presents for evaluation abdominal pain.  Patient states over the last 2 weeks she has noted pain to her right lower quadrant.  Worse with movement and walking.  She is not take anything for pain.  Pain is constant in nature.  Does not radiate to her pelvic region, flank.  Has had similar pain in the past which self resolved.  No recent injury or trauma.  She denies fever, chills, nausea, vomiting, chest pain, shortness of breath, dysuria, diarrhea, constipation, hematuria, vaginal discharge, concerns for STDs.  Denies additional aggravating or alleviating factors. Rates current pain a 10/10.  History obtained from patient and past medical records.  No interpretor was used.    HPI     Past Medical History:  Diagnosis Date  . Asthma   . Pregnancy induced hypertension     Patient Active Problem List   Diagnosis Date Noted  . S/P primary low transverse C-section for NRFHT 12/01/2015  . Post term pregnancy 11/28/2015    Past Surgical History:  Procedure Laterality Date  . BREAST LUMPECTOMY     11th grade L breast  . CESAREAN SECTION N/A 11/29/2015   Procedure: CESAREAN SECTION;  Surgeon: Tereso Newcomer, MD;  Location: [facility];  Service: Obstetrics;  Laterality: N/A;     OB History    Gravida  1   Para  1   Term  1   Preterm      AB      Living  1     SAB      IAB      Ectopic      Multiple  0   Live Births  1           Family History  Problem Relation Age of Onset  . Diabetes Mother     Social History   Tobacco Use  . Smoking status: Never Smoker  . Smokeless tobacco: Never Used  Substance Use Topics  . Alcohol use: No  . Drug use: No    Home Medications Prior to  Admission medications   Medication Sig Start Date End Date Taking? Authorizing Provider  HYDROcodone-acetaminophen (NORCO/VICODIN) 5-325 MG tablet Take 1 tablet by mouth every 4 (four) hours as needed. 10/28/20  Yes Shadrack Brummitt A, PA-C  ibuprofen (ADVIL,MOTRIN) 600 MG tablet Take 1 tablet (600 mg total) by mouth every 6 (six) hours. 12/01/15   Federico Flake, MD  naproxen (NAPROSYN) 375 MG tablet Take 1 tablet (375 mg total) by mouth 2 (two) times daily. 08/14/19   Palumbo, April, MD  oxyCODONE-acetaminophen (PERCOCET/ROXICET) 5-325 MG tablet Take 1 tablet by mouth every 4 (four) hours as needed (pain scale 4-7). 12/01/15   Federico Flake, MD  Prenatal Vit-Fe Fumarate-FA (PRENATAL MULTIVITAMIN) TABS tablet Take 1 tablet by mouth daily at 12 noon.    [provider]    Allergies    Patient has no known allergies.  Review of Systems   Review of Systems  Constitutional: Negative.   HENT: Negative.   Respiratory: Negative.   Cardiovascular: Negative.   Gastrointestinal: Positive for abdominal pain. Negative for abdominal distention, blood in stool, constipation, diarrhea, nausea, rectal pain and vomiting.  Genitourinary: Negative.  Musculoskeletal: Negative.   Skin: Negative.   Neurological: Negative.   All other systems reviewed and are negative.  Physical Exam Updated Vital Signs BP 106/84   Pulse 87   Temp 98.2 F (36.8 C) (Oral)   Resp 16   Ht 5\' 1"  (1.549 m)   Wt 69.4 kg   LMP 10/12/2020   SpO2 98%   BMI 28.89 kg/m   Physical Exam Vitals and nursing note reviewed.  Constitutional:      General: She is not in acute distress.    Appearance: She is well-developed and well-nourished. She is not ill-appearing, toxic-appearing or diaphoretic.  HENT:     Head: Normocephalic and atraumatic.     Mouth/Throat:     Mouth: Mucous membranes are moist.  Eyes:     Pupils: Pupils are equal, round, and reactive to light.  Neck:     Trachea: Phonation  normal.  Cardiovascular:     Rate and Rhythm: Normal rate.     Pulses: Normal pulses and intact distal pulses.          Radial pulses are 2+ on the right side and 2+ on the left side.     Heart sounds: Normal heart sounds.  Pulmonary:     Effort: Pulmonary effort is normal. No respiratory distress.     Breath sounds: Normal breath sounds and air entry.  Abdominal:     General: Bowel sounds are normal. There is no distension.     Palpations: Abdomen is soft.     Tenderness: There is abdominal tenderness in the right lower quadrant. There is no guarding or rebound. Negative signs include Murphy's sign, Rovsing's sign, McBurney's sign, psoas sign and obturator sign.     Hernia: No hernia is present.     Comments: Tenderness to RLQ. No rebound or guarding.  Genitourinary:    Comments: Declines GU, states no concern for STD pelvic pain Musculoskeletal:        General: Normal range of motion.     Cervical back: Full passive range of motion without pain and normal range of motion.     Comments: Moves all 4 extremities without difficulty  Skin:    General: Skin is warm and dry.     Capillary Refill: Capillary refill takes less than 2 seconds.     Comments: No rashes or lesions.  Neurological:     Mental Status: She is alert.     Comments: Ambulatory without difficulty  Psychiatric:        Mood and Affect: Mood and affect normal.    ED Results / Procedures / Treatments   Labs (all labs ordered are listed, but only abnormal results are displayed) Labs Reviewed  URINALYSIS, ROUTINE W REFLEX MICROSCOPIC - Abnormal; Notable for the following components:      Result Value   Color, Urine STRAW (*)    APPearance HAZY (*)    All other components within normal limits  LIPASE, BLOOD  COMPREHENSIVE METABOLIC PANEL  CBC  PREGNANCY, URINE    EKG None  Radiology CT Abdomen Pelvis W Contrast  Result Date: 10/28/2020 CLINICAL DATA:  Right lower quadrant pain for 2 weeks. EXAM: CT ABDOMEN  AND PELVIS WITH CONTRAST TECHNIQUE: Multidetector CT imaging of the abdomen and pelvis was performed using the standard protocol following bolus administration of intravenous contrast. CONTRAST:  10/30/2020 OMNIPAQUE IOHEXOL 300 MG/ML  SOLN COMPARISON:  CT abdomen pelvis 08/13/2019 FINDINGS: Exam is significantly limited by motion artifact particularly in the mid abdomen.  Lower chest: No acute abnormality. Hepatobiliary: No focal liver abnormality is seen. No gallstones, gallbladder wall thickening, or biliary dilatation. Pancreas: Unremarkable. No pancreatic ductal dilatation or surrounding inflammatory changes. Spleen: Normal in size without focal abnormality. Adrenals/Urinary Tract: Adrenal glands are unremarkable. Kidneys are normal, without renal calculi, focal lesion, or hydronephrosis. Bladder is unremarkable. Stomach/Bowel: Stomach is within normal limits. Appendix appears normal. Motion limits evaluation of the small bowel. Within the limitations no evidence of bowel wall thickening, distention, or inflammatory changes. Vascular/Lymphatic: No significant vascular findings are present. No enlarged abdominal or pelvic lymph nodes. Reproductive: Uterus and bilateral adnexa are unremarkable. Other: No abdominal wall hernia or abnormality. No abdominopelvic ascites. Musculoskeletal: No acute or significant osseous findings. IMPRESSION: 1. Exam is limited by motion artifact particularly in the mid abdomen. 2. Within the limitations no acute intra-abdominal pathology. Normal appendix. Electronically Signed   By: Emmaline KluverNancy  Ballantyne M.D.   On: 10/28/2020 18:01   US PELVIC COMPLETE W TRANSVAGINAL AND TORSION R/O  Result Date: 10/28/2020 CLINICAL DATA:  Right lower quadrant pain x2 weeks. EXAM: TRANSABDOMINAL AND TRANSVAGINAL ULTRASOUND OF PELVIS DOPPLER ULTRASOUND OF OVARIES TECHNIQUE: Both transabdominal and transvaginal ultrasound examinations of the pelvis were performed. Transabdominal technique was performed for  global imaging of the pelvis including uterus, ovaries, adnexal regions, and pelvic cul-de-sac. It was necessary to proceed with endovaginal exam following the transabdominal exam to visualize the uterus, endometrium and bilateral ovaries. Color and duplex Doppler ultrasound was utilized to evaluate blood flow to the ovaries. COMPARISON:  July 03, 2015 FINDINGS: Uterus Measurements: 9.5 cm x 4.3 cm x 5.0 cm = volume: 105.1 mL. No fibroids or other mass visualized. Endometrium Thickness: 16 mm. Mildly heterogeneous in appearance. No focal abnormality visualized. Right ovary Measurements: 2.9 cm x 1.9 cm x 2.4 cm = volume: 7.1 mL. A 1.4 cm x 1.2 cm x 1.3 cm mildly hyperechoic area is seen within the right ovary. Flow is seen along the periphery of this region on color Doppler evaluation. Left ovary Measurements: 2.8 cm x 2.3 cm x 2.4 cm = volume: 7.9 mL. A 1.5 cm x 1.6 cm x 1.6 cm hypoechoic structure is noted within the left ovary. No abnormal flow is seen within this region on color Doppler evaluation. Pulsed Doppler evaluation of both ovaries demonstrates normal low-resistance arterial and venous waveforms. Other findings A trace amount of pelvic free fluid is noted. IMPRESSION: 1. Benign appearing left ovarian cyst. 2. Findings which may represent a small hemorrhagic right ovarian cyst. Correlation with 6-12 week follow-up pelvic ultrasound is recommended to ensure resolution. Reference: Radiology 2019 Nov; 293(2):359-371. Electronically Signed   By: Aram Candelahaddeus  Houston M.D.   On: 10/28/2020 19:35   Procedures Procedures (including critical care time)  Medications Ordered in ED Medications  sodium chloride 0.9 % bolus 1,000 mL (0 mLs Intravenous Stopped 10/28/20 1826)  ondansetron (ZOFRAN) injection 4 mg (4 mg Intravenous Given 10/28/20 1715)  morphine 4 MG/ML injection 4 mg (4 mg Intravenous Given 10/28/20 1716)  iohexol (OMNIPAQUE) 300 MG/ML solution 100 mL (100 mLs Intravenous Contrast Given 10/28/20  1737)   ED Course  I have reviewed the triage vital signs and the nursing notes.  Pertinent labs & imaging results that were available during my care of the patient were reviewed by me and considered in my medical decision making (see chart for details).  35 year old presents for evaluation of right lower quadrant pain x2 weeks.  Symptoms constant.  Worse with movement.  She has no pelvic pain,  vaginal discharge or concerns for STDs.  She declines GU exam here in the ED today.  No urinary complaints.  Her heart and lungs are clear.  Her abdomen is soft however she does have some mild tenderness to her right lower quadrant.  She has no peritoneal signs on exam.  She is actually been seen previously for similar complaints however a few years ago.  She has been tolerating p.o. intake without difficulty.  Work-up started from triage  Labs and imaging personally reviewed and interpreted:  CBC without leukocytosis Preg negative UA negative for infection Lipase 35 CMP without electrolyte, renal or liver abnormality CT AP without acute abnormality [place] with right ovarian hemorrhagic cyst. No torsion  Patient reassessed.  Pain controlled.  Tolerating p.o. intake.  Discussed ultrasound with cyst as likely cause of pain.  Discussed close follow-up with.  OB/GYN.  She is agreeable to this  Patient is nontoxic, nonseptic appearing, in no apparent distress.  Patient's pain and other symptoms adequately managed in emergency department.  Fluid bolus given.  Labs, imaging and vitals reviewed.  Patient does not meet the SIRS or Sepsis criteria.  On repeat exam patient does not have a surgical abdomin and there are no peritoneal signs.  No indication of appendicitis, bowel obstruction, bowel perforation, cholecystitis, diverticulitis, PID, TOA, torsion or ectopic pregnancy.    The patient has been appropriately medically screened and/or stabilized in the ED. I have low suspicion for any other emergent medical  condition which would require further screening, evaluation or treatment in the ED or require inpatient management.  Patient is hemodynamically stable and in no acute distress.  Patient able to ambulate in department prior to ED.  Evaluation does not show acute pathology that would require ongoing or additional emergent interventions while in the emergency department or further inpatient treatment.  I have discussed the diagnosis with the patient and answered all questions.  Pain is been managed while in the emergency department and patient has no further complaints prior to discharge.  Patient is comfortable with plan discussed in room and is stable for discharge at this time.  I have discussed strict return precautions for returning to the emergency department.  Patient was encouraged to follow-up with PCP/specialist refer to at discharge.    MDM Rules/Calculators/A&P                           Final Clinical Impression(s) / ED Diagnoses Final diagnoses:  RLQ abdominal pain  Cyst of right ovary    Rx / DC Orders ED Discharge Orders         Ordered    HYDROcodone-acetaminophen (NORCO/VICODIN) 5-325 MG tablet  Every 4 hours PRN        10/28/20 1942           Mindi Akerson A, PA-C 10/28/20 1945    Arby Barrette, MD 10/29/20 1942

## 2020-10-28 NOTE — ED Triage Notes (Signed)
RLQ x 2 weeks. Denies N/V/D, vaginal discharge. Pain is worse when walking.

## 2020-10-28 NOTE — ED Notes (Signed)
Patient transported to CT 

## 2020-10-28 NOTE — Discharge Instructions (Addendum)
You have an ovarian cyst on the right side which is likely the cause of the pain.  Take the pain medicine as prescribed.  This has Tylenol in it so do not take any additional Tylenol while taking this medication.  Follow-up with an OB/GYN in 6 weeks for reevaluation.

## 2021-04-07 IMAGING — CT CT ABD-PELV W/ CM
2 of 4 series · 16 of 46 positions shown, 18 images · IV contrast (omnipaque)
Comparison: CT abdomen pelvis 08/13/2019

CLINICAL DATA: Right lower quadrant pain for 2 weeks.

EXAM:
CT ABDOMEN AND PELVIS WITH CONTRAST
TECHNIQUE: Multidetector CT imaging of the abdomen and pelvis was performed
using the standard protocol following bolus administration of
intravenous contrast.
CONTRAST:  100mL OMNIPAQUE IOHEXOL 300 MG/ML  SOLN

[Series 2: axial st · axial · 0.79mm/px · z∈[+698,+1104]mm · 13 of 89 slices shown, 15 images]
[im 4/89  soft-tissue]
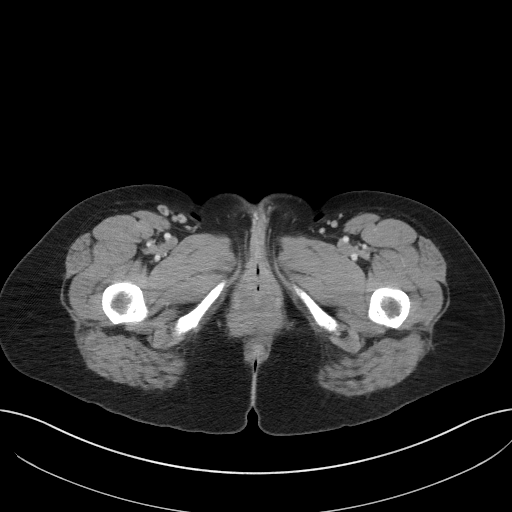
[im 4/89  bone]
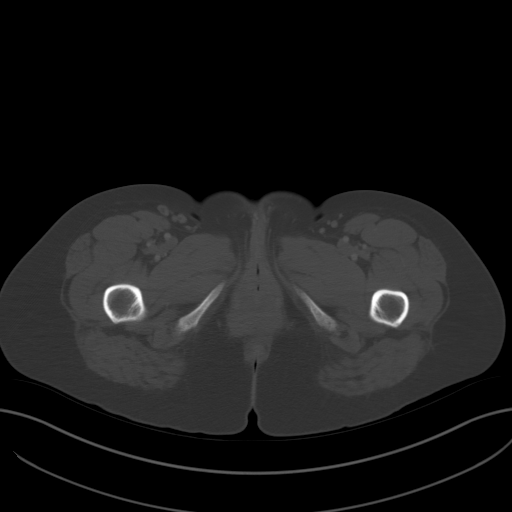
[im 11/89  soft-tissue]
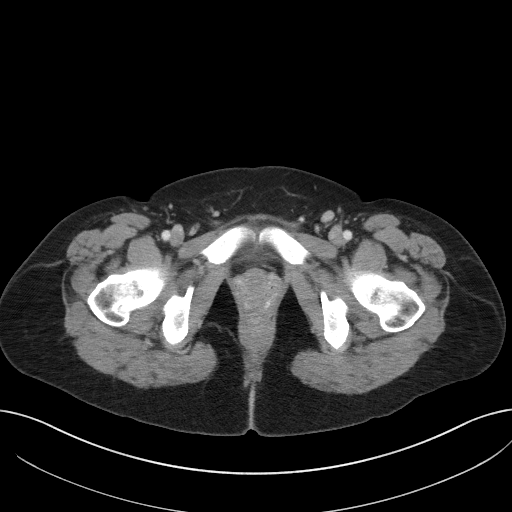
[im 18/89  soft-tissue]
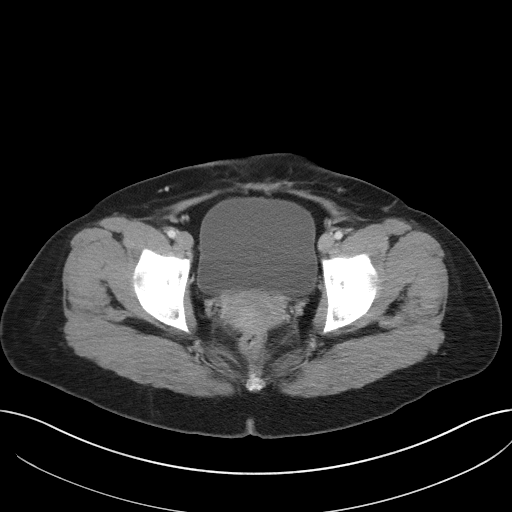
[im 25/89  soft-tissue]
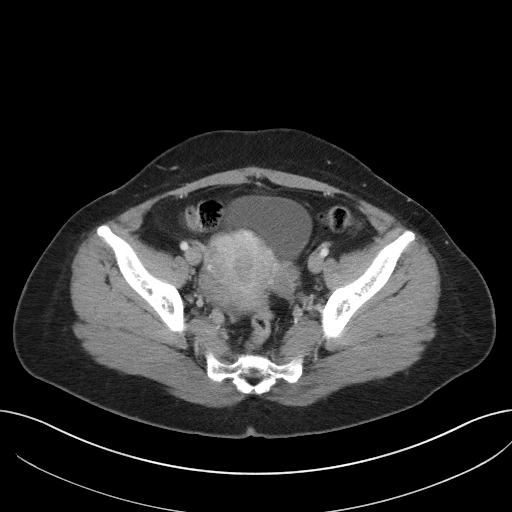
[im 32/89  soft-tissue]
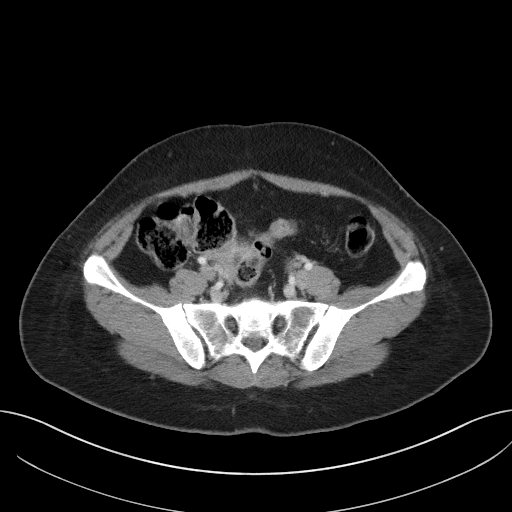
[im 39/89  soft-tissue]
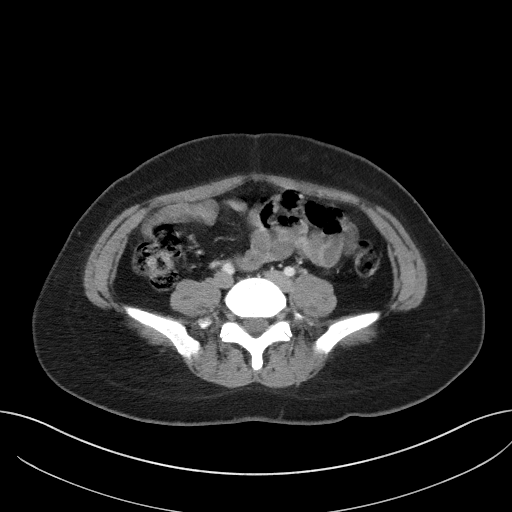
[im 46/89  soft-tissue]
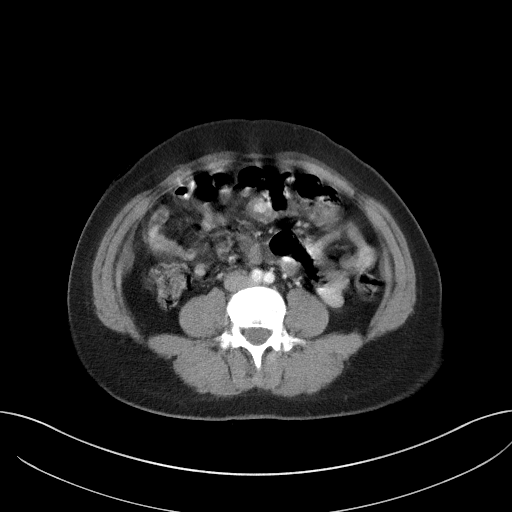
[im 50/89  soft-tissue]
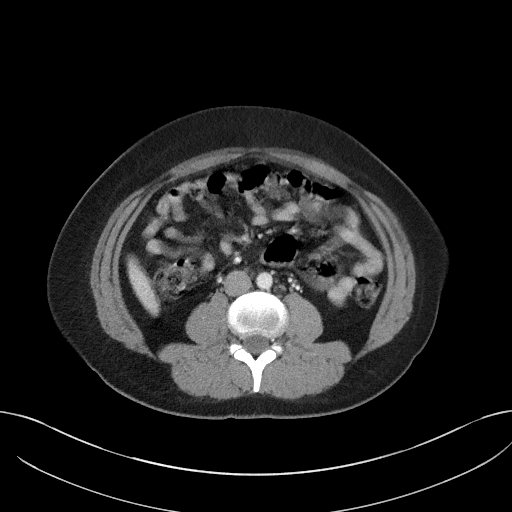
[im 57/89  soft-tissue]
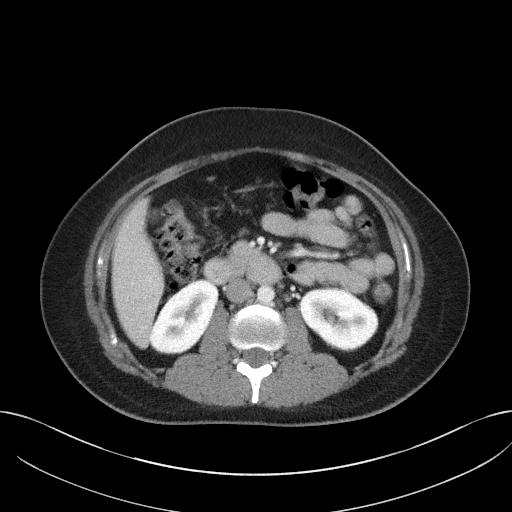
[im 57/89  bone]
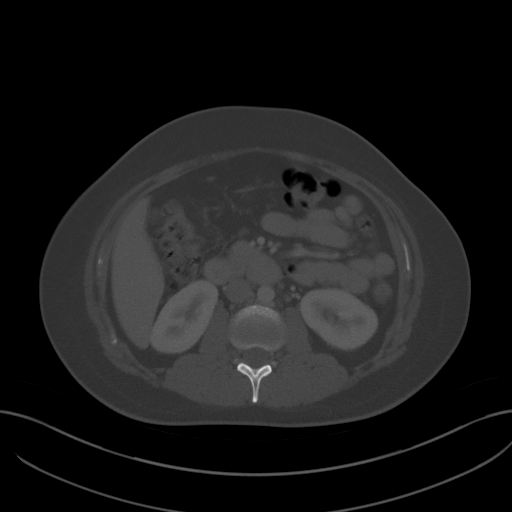
[im 64/89  soft-tissue]
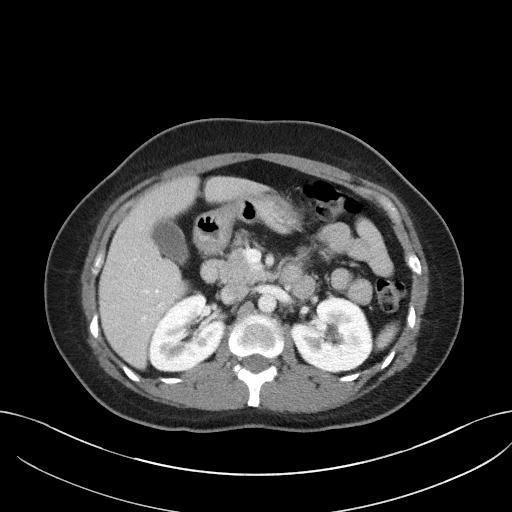
[im 71/89  soft-tissue]
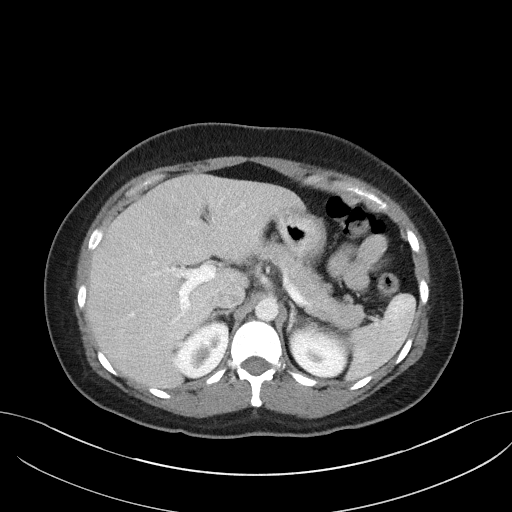
[im 78/89  soft-tissue]
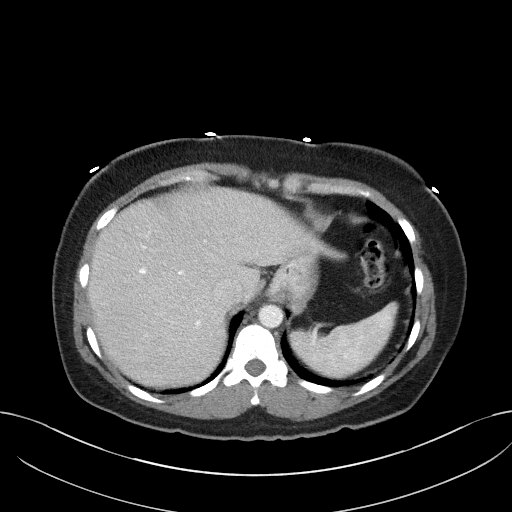
[im 85/89  soft-tissue]
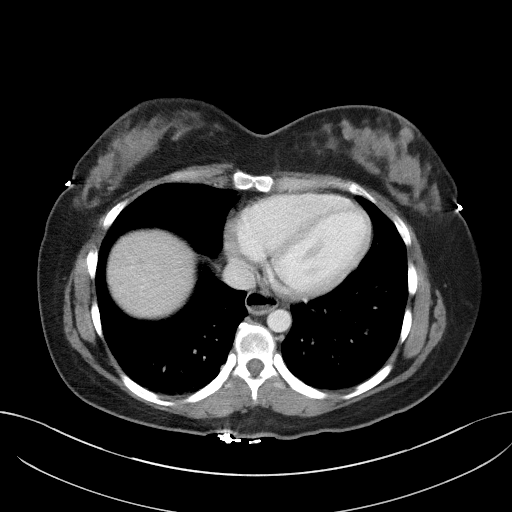

[Series 5: coronal st · coronal · 0.82mm/px · 3 of 95 slices shown]
[im 32/95  soft-tissue]
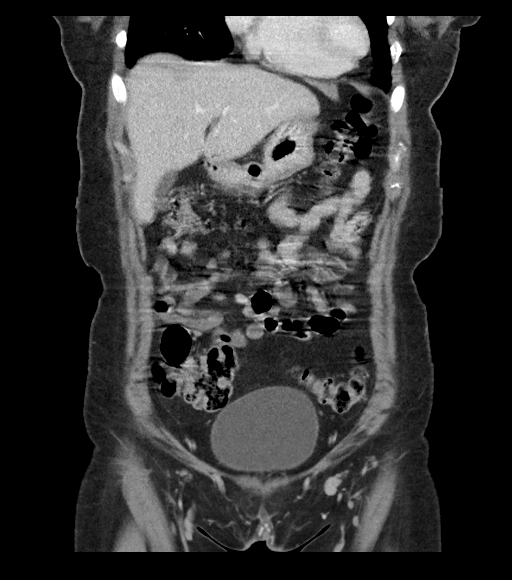
[im 42/95  soft-tissue]
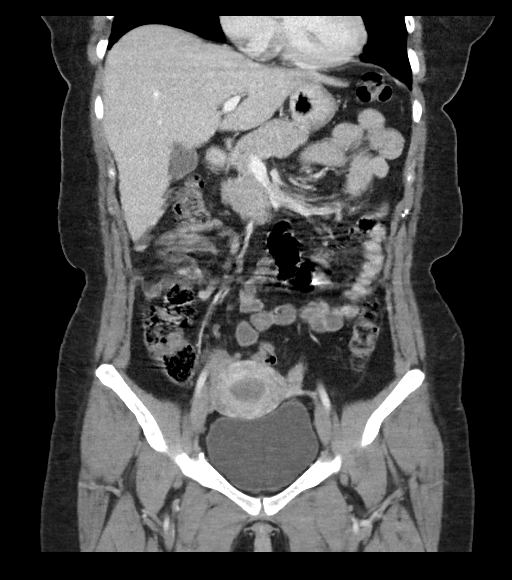
[im 53/95  soft-tissue]
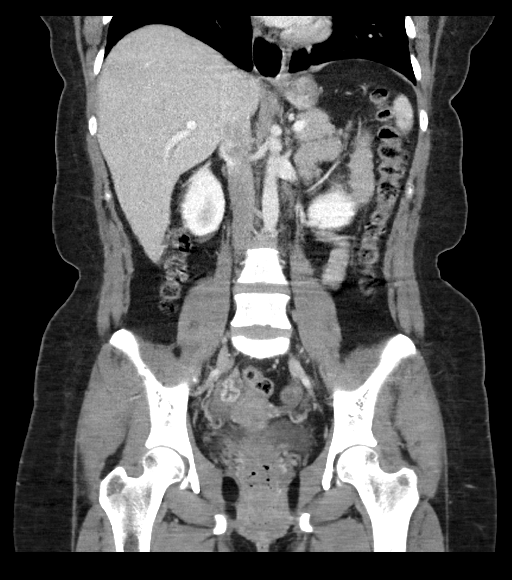

[16 of 46 positions shown; findings below may reference images not displayed]

FINDINGS: Exam is significantly limited by motion artifact particularly in the
mid abdomen.

Lower chest: No acute abnormality.

Hepatobiliary: No focal liver abnormality is seen. No gallstones,
gallbladder wall thickening, or biliary dilatation.

Pancreas: Unremarkable. No pancreatic ductal dilatation or
surrounding inflammatory changes.

Spleen: Normal in size without focal abnormality.

Adrenals/Urinary Tract: Adrenal glands are unremarkable. Kidneys are
normal, without renal calculi, focal lesion, or hydronephrosis.
Bladder is unremarkable.

Stomach/Bowel: Stomach is within normal limits. Appendix appears
normal. Motion limits evaluation of the small bowel. Within the
limitations no evidence of bowel wall thickening, distention, or
inflammatory changes.

Vascular/Lymphatic: No significant vascular findings are present. No
enlarged abdominal or pelvic lymph nodes.

Reproductive: Uterus and bilateral adnexa are unremarkable.

Other: No abdominal wall hernia or abnormality. No abdominopelvic
ascites.

Musculoskeletal: No acute or significant osseous findings.
IMPRESSION: 1. Exam is limited by motion artifact particularly in the mid
abdomen.
2. Within the limitations no acute intra-abdominal pathology. Normal
appendix.

## 2021-04-07 IMAGING — US US PELVIS COMPLETE TRANSABD/TRANSVAG W DUPLEX
1 series · 13 of 25 positions shown · non-contrast
Comparison: July 03, 2015

CLINICAL DATA: Right lower quadrant pain x2 weeks.

EXAM:
TRANSABDOMINAL AND TRANSVAGINAL ULTRASOUND OF PELVIS
DOPPLER ULTRASOUND OF OVARIES
TECHNIQUE: Both transabdominal and transvaginal ultrasound examinations of the
pelvis were performed. Transabdominal technique was performed for
global imaging of the pelvis including uterus, ovaries, adnexal
regions, and pelvic cul-de-sac.
It was necessary to proceed with endovaginal exam following the
transabdominal exam to visualize the uterus, endometrium and
bilateral ovaries. Color and duplex Doppler ultrasound was utilized
to evaluate blood flow to the ovaries.

[Series 1: us pelvis complete transabd/transvag w duplex · 13 of 98 slices shown]
[im 1/98]
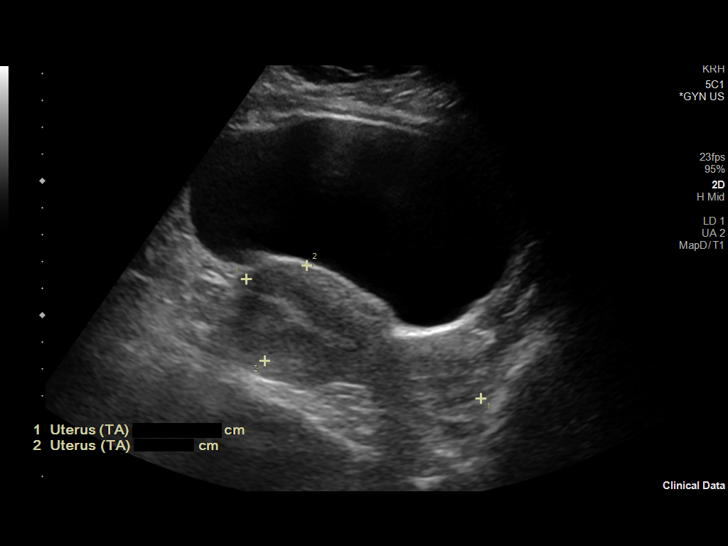
[im 9/98]
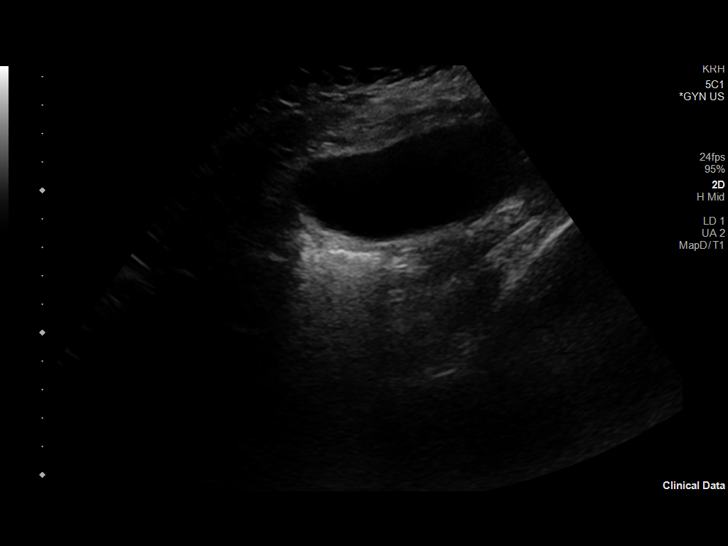
[im 17/98]
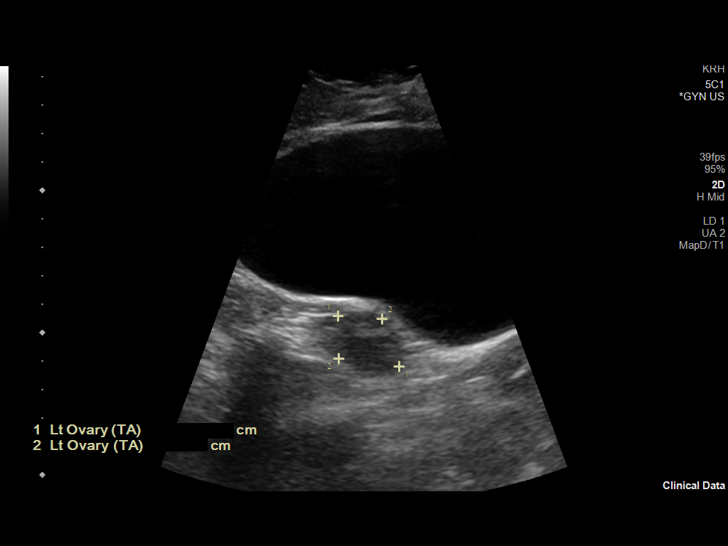
[im 25/98]
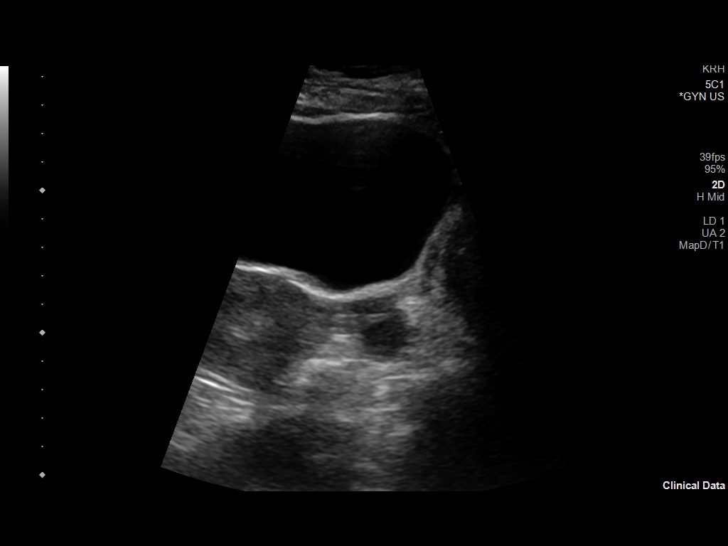
[im 33/98]
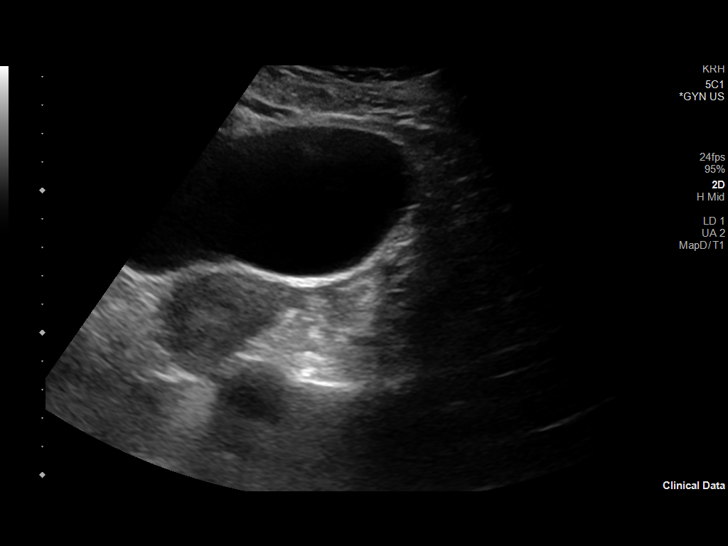
[im 41/98]
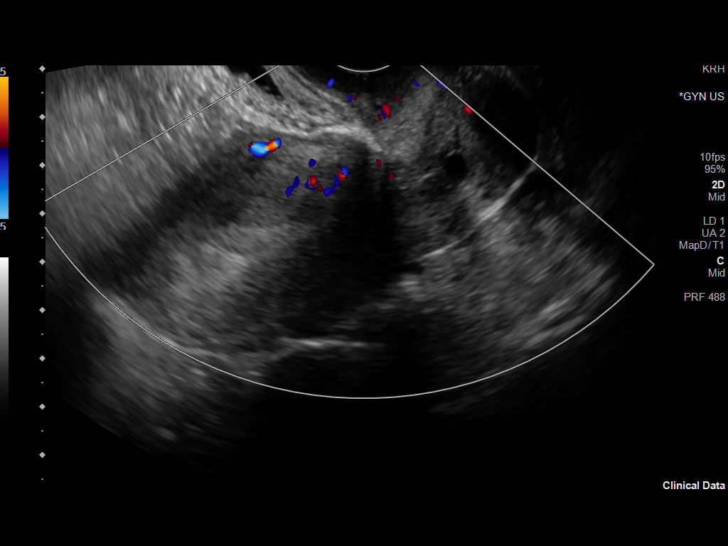
[im 49/98]
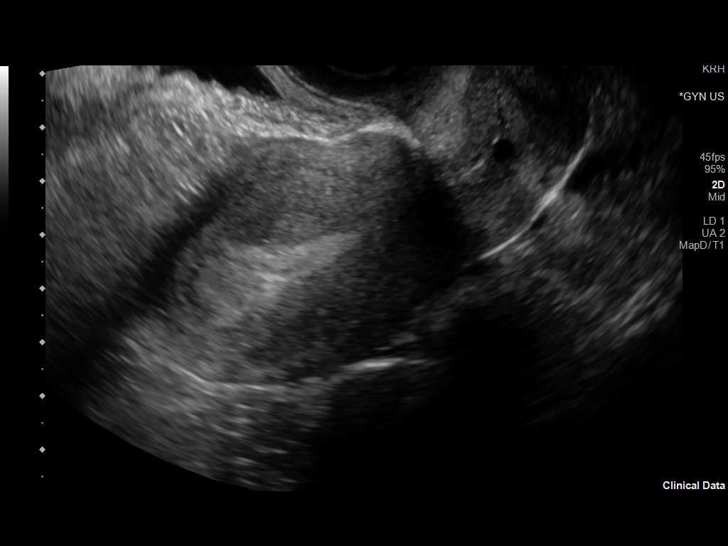
[im 57/98]
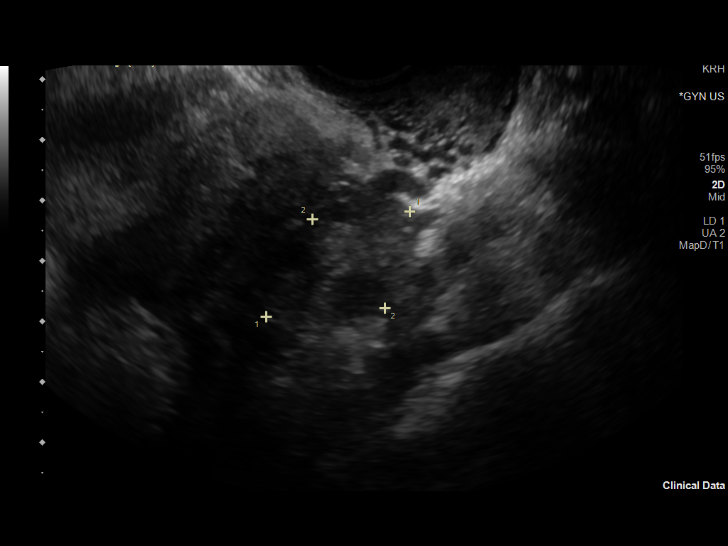
[im 65/98]
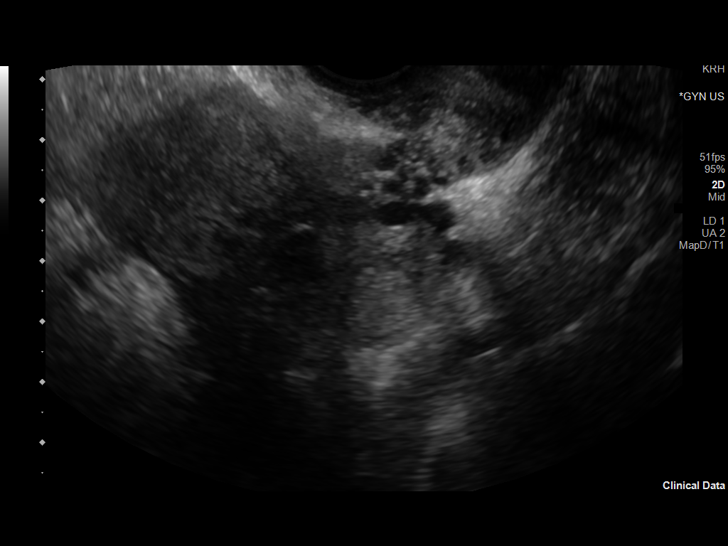
[im 73/98]
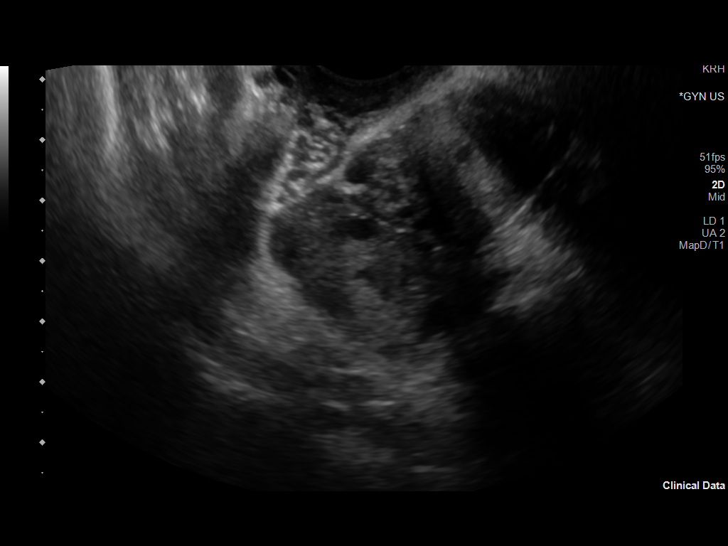
[im 81/98]
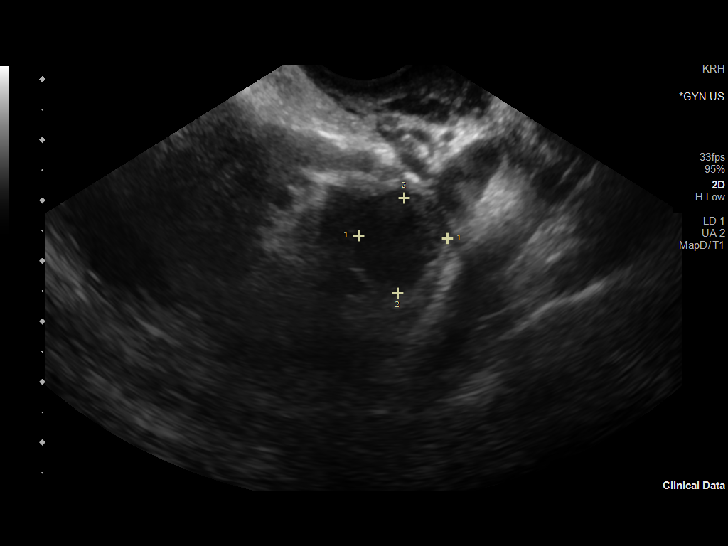
[im 89/98]
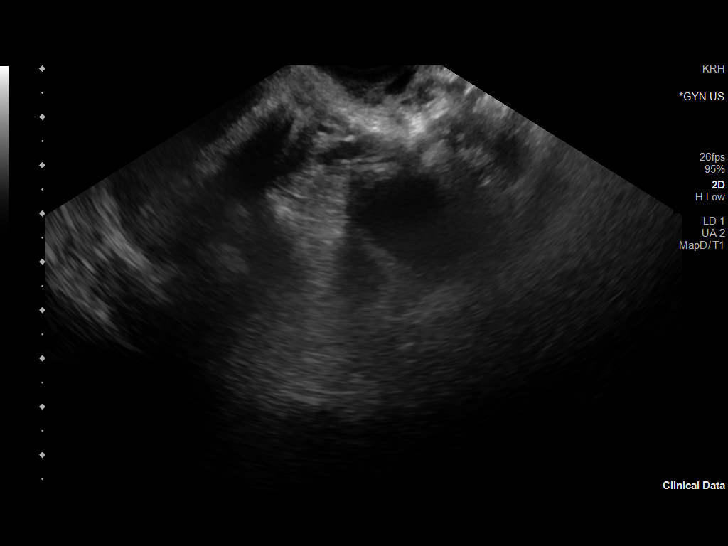
[im 98/98]
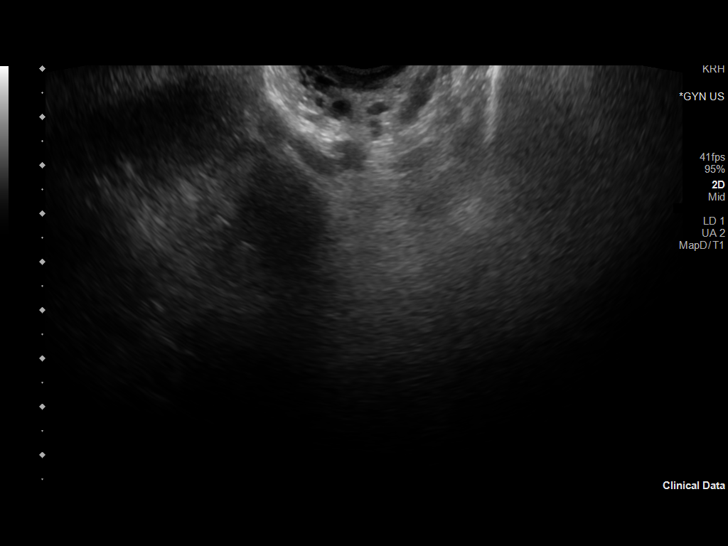

[13 of 25 positions shown; findings below may reference images not displayed]

FINDINGS: Uterus

Measurements: 9.5 cm x 4.3 cm x 5.0 cm = volume: 105.1 mL. No
fibroids or other mass visualized.

Endometrium

Thickness: 16 mm. Mildly heterogeneous in appearance. No focal
abnormality visualized.

Right ovary

Measurements: 2.9 cm x 1.9 cm x 2.4 cm = volume: 7.1 mL. A 1.4 cm x
1.2 cm x 1.3 cm mildly hyperechoic area is seen within the right
ovary. Flow is seen along the periphery of this region on color
Doppler evaluation.

Left ovary

Measurements: 2.8 cm x 2.3 cm x 2.4 cm = volume: 7.9 mL. A 1.5 cm x
1.6 cm x 1.6 cm hypoechoic structure is noted within the left ovary.
No abnormal flow is seen within this region on color Doppler
evaluation.

Pulsed Doppler evaluation of both ovaries demonstrates normal
low-resistance arterial and venous waveforms.

Other findings

A trace amount of pelvic free fluid is noted.
IMPRESSION: 1. Benign appearing left ovarian cyst.
2. Findings which may represent a small hemorrhagic right ovarian
cyst. Correlation with 6-12 week follow-up pelvic ultrasound is
recommended to ensure resolution. Reference: Radiology [DATE];
